# Patient Record
Sex: Female | Born: 1935 | Race: White | Hispanic: No | Marital: Married | Smoking: Never smoker
Health system: Southern US, Community
[De-identification: ages and names within clinical notes are randomized; demographics above are authoritative.]

## PROBLEM LIST (undated history)

## (undated) DIAGNOSIS — E079 Disorder of thyroid, unspecified: Secondary | ICD-10-CM

## (undated) DIAGNOSIS — I10 Essential (primary) hypertension: Secondary | ICD-10-CM

## (undated) HISTORY — DX: Disorder of thyroid, unspecified: E07.9

## (undated) HISTORY — PX: REPLACEMENT TOTAL KNEE BILATERAL: SUR1225

---

## 2006-12-23 ENCOUNTER — Ambulatory Visit: Payer: Self-pay | Admitting: Cardiology

## 2006-12-30 ENCOUNTER — Ambulatory Visit: Payer: Self-pay | Admitting: Cardiology

## 2007-02-05 ENCOUNTER — Ambulatory Visit: Payer: Self-pay | Admitting: Cardiology

## 2007-02-10 ENCOUNTER — Ambulatory Visit: Payer: Self-pay | Admitting: Cardiology

## 2010-10-27 NOTE — Assessment & Plan Note (Signed)
[facility]                          [facility] OFFICE NOTE   NAME:Hougland, MERIS REEDE                      MRN:          [id number]  DATE:12/23/2006                            DOB:          01-Sep-1935    REASON FOR CONSULTATION:  Evaluation of a 75 year old female with  presumed new onset atrial fibrillation.   HISTORY OF PRESENT ILLNESS:  The patient is a 75 year old female with  multiple cardiac factors. The patient was referred by [facility] due to complaints of palpitations and burning in the chest  occurring predominantly at night. I assumed that the patient was found  to have atrial fibrillation as this is recorded is in the notes. I do  not have an EKG however to confirm that. Today in the office, the  patient is in normal sinus rhythm. Her main complaint centers around  palpitations and burning the chest. The patient states that for the last  several weeks, she has been waking up in the middle of the night with a  sensation of burning in the retrosternal area. There is associated  shortness of breath, as well as diaphoresis. There is also some nausea.  It was felt that the patient could have gastroesophageal reflux and has  been started  in the interval on Prilosec. However, there has been no  significant improvement in her symptomatology. Also, over the last  several months, in particular, she recalls an episode about two months  ago at the floral store where she felt to have a rapid heart rate with  palpitations. She has no associated pre-syncope or syncope. Since that  time, the patient has cut back on caffeinated drinks with some  improvement in the frequency of her palpitations. Of note is also that  she has noticed that she has some shortness of breath and when she tries  to cough her palpitations seem to get better.   The patient denies dyspnea at rest, but reports exertional shortness of  breath. The patient had  laboratory work done which I reviewed today  which was essentially within normal limits including a TSH. Her EKG in  the office also shows normal sinus rhythm with no acute ischemic  changes.   MEDICATIONS:  1. Synthroid 25 mcg today.  2. Lotensin 10 mg daily.  3. Zocor 80 mg daily.  4. Prilosec 20 mg b.i.d.  5. Aspirin 81 mg daily.   PAST MEDICAL HISTORY:  Dyslipidemia, hypertension, and thyroid disease.   SOCIAL HISTORY:  The patient lives in [place]. She denies tobacco  use.   FAMILY HISTORY:  Notable for her mother dying of an aneurysm. Father  died from congestive heart failure at age 51. Brother has diabetes  mellitus and is on dialysis. Two other brothers are in good health. She  has a sister who has no significant cardiovascular disease.   REVIEW OF SYSTEMS:  As per HPI. No nausea or vomiting. No fevers or  chills. No melena or hematochezia. No dysuria or frequency. No orthopnea  or PND. The patient reports loud snoring, daytime somnolence, and early  morning fatigue.  PHYSICAL EXAMINATION:  VITAL SIGNS:  Blood pressure 160/84, heart rate  80 beats-per-minute.  GENERAL:  Well nourished white female who is somewhat pale appearing,  but in no apparent distress.  NECK:  Normal carotid upstroke and no carotid bruits.  LUNGS:  Clear breath sounds bilaterally.  HEART:  Regular rate and rhythm, normal S1, S2, no murmurs, rubs, or  gallops.  ABDOMEN:  Soft and nontender. No rebound or guarding. Good bowel sounds.  EXTREMITIES:  No cyanosis, clubbing, or edema.  NEUROLOGIC:  Alert, oriented, and grossly non-focal.   PROBLEM LIST:  1. Palpitations.  2. Paroxysmal atrial fibrillation.  3. Substernal chest pain, rule out ischemic heart disease.  4. Multiple cardiac risk factors.  5. Rule out obstructive sleep apnea.  6. Dyslipidemia.   PLAN:  1. Although I do not have documentation of atrial fibrillation, this      was diagnosed at the [facility]. We will  try to get an      EKG stat to confirm this finding. The patient now in normal sinus      rhythm.  2. I will start patient on a calcium channel blocker given her      presumed history of atrial fibrillation and recurrent palpitations,      as well as hypertension.  3. The patient's nocturnal pain is concerning for ischemic heart      disease and she will be scheduled for an adenosine Cardiolite two      day protocol.  4. I suspect also clinically that the patient may have obstructive      sleep apnea which can contribute to the frequency of atrial      fibrillation and I have referred her for an apnea link monitor.  5. The patient also has been scheduled for a CardioNet monitor to      assess her atrial fibrillation burden and further need for      treatment.  6. The patient will follow up with me in the next three weeks.     Learta Codding, MD,FACC  Electronically Signed    GED/MedQ  DD: 12/23/2006  DT: 12/24/2006  Job #: [id number]   cc:   [facility]

## 2010-10-27 NOTE — Assessment & Plan Note (Signed)
[facility]                          [facility]ICE NOTE   NAME:Sandoval, Dorothy HIRT                      MRN:          [id number]  DATE:02/10/2007                            DOB:          11/25/1935    HISTORY OF PRESENT ILLNESS:  Patient is a 75 year old female with a  history of multiple cardiac risk factors.  Patient has been evaluated  for palpitations.  There is a questionable history of paroxysmal atrial  fibrillation; however, we have not been able to document this.  The  patient had a CardioNet monitor done which showed no atrial fibrillation  but normal sinus rhythm.  Patient has also had no recurrent palpitations  on Cardizem.  Patient had a Cardiolite stress study done with overall  low risk without definite frank ischemia.   Patient had an apneonic monitor done from which we still do not have the  results available, and this will be followed up.  Patient is otherwise  asymptomatic.  She has no orthopnea, PND, palpitations, or syncope.   The patient otherwise has no complaints in the office today.   MEDICATIONS:  1. Synthroid 25 mcg p.o. daily.  2. Lotensin 10 mg p.o. daily.  3. Zocor 8 mg p.o. daily.  4. Prilosec 20 mg p.o. b.i.d.  5. Aspirin 81 mg p.o. daily.  6. Diltiazem 120 mg p.o. daily.   PHYSICAL EXAMINATION:  VITAL SIGNS:  Blood pressure 150/80, heart rate  80.  Weight is 203 pounds.  NECK:  Normal carotid upstrokes.  No carotid bruits.  LUNGS:  Clear breath sounds bilaterally.  HEART:  Regular rate and rhythm.  Normal S1 and S2.  No murmurs or  gallops.  ABDOMEN:  Soft.  EXTREMITIES:  No clubbing, cyanosis or edema.  NEURO:  Patient is alert, oriented, grossly nonfocal.   PROBLEM LIST:  1. Palpitations, resolved.  2. Questionable paroxysmal atrial fibrillation with no evidence of      CardioNet monitor.  3. Substernal chest pain, rule out for ischemic cardiomyopathy.      Negative Cardiolite study.  4. Multiple  cardiac risk factors, rule out obstructive sleep apnea,      apneonic monitor pending.  5. Dyslipidemia.   PLAN:  1. We will review the results of the apneonic monitor which are still      pending.  2. CardioNet monitor review shows no evidence of atrial fibrillation.      The patient can continue      with current medical therapy and aspirin only.  3. No further ischemia workup is needed, as the patient's Cardiolite      study was low risk.     Learta Codding, MD,FACC  Electronically Signed    GED/MedQ  DD: 02/10/2007  DT: 02/11/2007  Job #: [id number]

## 2015-12-24 ENCOUNTER — Encounter (HOSPITAL_COMMUNITY): Payer: Self-pay

## 2015-12-24 ENCOUNTER — Emergency Department (HOSPITAL_COMMUNITY)
Admission: EM | Admit: 2015-12-24 | Discharge: 2015-12-25 | Disposition: A | Discharge status: Home / Self Care | Payer: Medicare Other | Attending: Emergency Medicine | Admitting: Emergency Medicine

## 2015-12-24 ENCOUNTER — Emergency Department (HOSPITAL_COMMUNITY): Payer: Medicare Other

## 2015-12-24 DIAGNOSIS — R0602 Shortness of breath: Secondary | ICD-10-CM | POA: Diagnosis not present

## 2015-12-24 DIAGNOSIS — M7989 Other specified soft tissue disorders: Secondary | ICD-10-CM | POA: Insufficient documentation

## 2015-12-24 DIAGNOSIS — I10 Essential (primary) hypertension: Secondary | ICD-10-CM | POA: Diagnosis not present

## 2015-12-24 DIAGNOSIS — R05 Cough: Secondary | ICD-10-CM | POA: Diagnosis not present

## 2015-12-24 HISTORY — DX: Essential (primary) hypertension: I10

## 2015-12-24 MED ORDER — IPRATROPIUM-ALBUTEROL 0.5-2.5 (3) MG/3ML IN SOLN
RESPIRATORY_TRACT | Status: AC
  Administered 2015-12-24: 3 mL
  Filled 2015-12-24: qty 3

## 2015-12-24 NOTE — Progress Notes (Signed)
Patient came in with SOB. Nebulizer given, patient feels like this helped. RT will continue to monitor.

## 2015-12-24 NOTE — ED Provider Notes (Signed)
CSN: [id number]     Arrival date & time 12/24/15  2314 History  By signing my name below, I, Emmanuella Mensah, attest that this documentation has been prepared under the direction and in the presence of Shon Batonourtney F Indiana Gamero, MD. Electronically Signed: Angelene GiovanniEmmanuella Mensah, ED Scribe. 12/24/2015. 2:34 AM.    Chief Complaint  Patient presents with  . Shortness of Breath   Patient is a 80 y.o. female presenting with shortness of breath. The history is provided by the patient. No language interpreter was used.  Shortness of Breath Severity:  Moderate Onset quality:  Gradual Timing:  Intermittent Progression:  Worsening Relieved by:  None tried Worsened by:  Nothing tried Ineffective treatments:  None tried Associated symptoms: cough   Associated symptoms: no abdominal pain, no chest pain, no fever and no vomiting   Risk factors: no recent alcohol use    HPI Comments: Dorothy Sandoval is a 80 y.o. female with a hx of hypertension who presents to the Emergency Department complaining of gradually worsening shortness of breath onset several months ago. She reports associated leg swelling and non-productive cough. She states that her SOB is worse when she lays down. Pt denies any home O2 use. No alleviating factors noted. Pt has not tried any medications PTA. She denies any sick contacts. She states that she takes Lasix and has not had any medication changes. She denies a hx of CHF or COPD. Pt is not a current smoker. She reports NKDA. She denies any fever, chills, chest pain, abdominal pain, or n/v/d.    Past Medical History  Diagnosis Date  . Hypertension    History reviewed. No pertinent past surgical history. No family history on file. Social History  Substance Use Topics  . Smoking status: Never Smoker   . Smokeless tobacco: None  . Alcohol Use: No   OB History    No data available     Review of Systems  Constitutional: Negative for fever and chills.  Respiratory: Positive for cough  and shortness of breath.   Cardiovascular: Positive for leg swelling. Negative for chest pain.  Gastrointestinal: Negative for vomiting and abdominal pain.  All other systems reviewed and are negative.     Allergies  Review of patient's allergies indicates no known allergies.  Home Medications   Prior to Admission medications   Not on File   BP 148/54 mmHg  Pulse 74  Temp(Src) 97.5 F (36.4 C) (Oral)  Resp 14  SpO2 97% Physical Exam  Constitutional: She is oriented to person, place, and time. She appears well-developed and well-nourished.  Elderly, no acute distress  HENT:  Head: Normocephalic and atraumatic.  Cardiovascular: Normal rate, regular rhythm and normal heart sounds.   No murmur heard. Pulmonary/Chest: Effort normal and breath sounds normal. No respiratory distress. She has no wheezes.  No crackles noted  Abdominal: Soft. Bowel sounds are normal. There is no tenderness. There is no rebound.  Musculoskeletal:  Trace bilateral lower extremity edema  Neurological: She is alert and oriented to person, place, and time.  Skin: Skin is warm and dry.  Psychiatric: She has a normal mood and affect.  Nursing note and vitals reviewed.   ED Course  Procedures (including critical care time) DIAGNOSTIC STUDIES: Oxygen Saturation is 99% on [place], normal by my interpretation.    COORDINATION OF CARE: 12:14 AM- Pt advised of plan for treatment and pt agrees. Pt will receive chest x-ray, lab work, and EKG for further evaluation. She will also receive Duoneb.  Labs Review Labs Reviewed  BASIC METABOLIC PANEL - Abnormal; Notable for the following:    Glucose, Bld 127 (*)    All other components within normal limits  D-DIMER, QUANTITATIVE (NOT AT [facility]) - Abnormal; Notable for the following:    D-Dimer, Quant 0.55 (*)    All other components within normal limits  CBC WITH DIFFERENTIAL/PLATELET  BRAIN NATRIURETIC PEPTIDE  TROPONIN I    Imaging Review Dg Chest 2  View  12/24/2015  CLINICAL DATA:  Nonproductive cough and worsening shortness of breath for 2 days. History of hypertension. EXAM: CHEST  2 VIEW COMPARISON:  None available for comparison at time of study interpretation. FINDINGS: The cardiac silhouette is moderately enlarged, mediastinal silhouette is nonsuspicious. Mild bronchitic changes. Strandy densities in lung bases. No pleural effusion or focal consolidation. Mild biapical pleural thickening. No pneumothorax. Large body habitus. Osteopenia. Sub cm calcification projecting at RIGHT humeral head associated with calcific tendinopathy. IMPRESSION: Moderate cardiomegaly. Mild bronchitic changes. Bibasilar atelectasis/scarring. Electronically Signed   By: Awilda Metro M.D.   On: 12/24/2015 23:53   Shon Baton, MD has personally reviewed and evaluated these images and lab results as part of her medical decision-making.   EKG Interpretation   Date/Time:  Wednesday December 24 2015 23:20:49 EDT Ventricular Rate:  68 PR Interval:    QRS Duration: 94 QT Interval:  392 QTC Calculation: 417 R Axis:   51 Text Interpretation:  Sinus rhythm Abnormal R-wave progression, early  transition Confirmed by Calix Heinbaugh  MD, Elijah Phommachanh ([id number]) on 12/25/2015 12:14:38  AM      MDM   Final diagnoses:  Shortness of breath    Patient presents with shortness of breath. Nontoxic on exam. Afebrile. Mildly hypertensive but otherwise vital signs are reassuring. Physical exam is largely unremarkable. She does not appear overtly volume overloaded. She is in no acute distress. EKG is nonischemic. chest x-ray shows cardiomegaly without edema.  No documented echocardiogram in our system. BNP, troponin, and basic labwork is reassuring. D-dimer is 0.55. Based on age adjusted cut offs, this is negative. Patient was able to ambulate and maintain pulse ox 96-97%. She continues to be in no acute distress. Discussed with patient and her family that she needs to follow-up with  cardiology for an echocardiogram as well as with her primary physician as she may ultimately need further pulmonary testing if her symptoms persist.  After history, exam, and medical workup I feel the patient has been appropriately medically screened and is safe for discharge home. Pertinent diagnoses were discussed with the patient. Patient was given return precautions.  I personally performed the services described in this documentation, which was scribed in my presence. The recorded information has been reviewed and is accurate.   Shon Baton, MD 12/25/15 [phone]

## 2015-12-24 NOTE — ED Notes (Signed)
Pt reports that she has been coughing for 2 days. Cough non productive. Reports difficulty breathing laying down. Pt reports she does not wear O2 at home

## 2015-12-24 NOTE — ED Notes (Signed)
Sob progressively worse today

## 2015-12-25 LAB — BASIC METABOLIC PANEL
ANION GAP: 6 (ref 5–15)
BUN: 11 mg/dL (ref 6–20)
CHLORIDE: 102 mmol/L (ref 101–111)
CO2: 27 mmol/L (ref 22–32)
Calcium: 9.1 mg/dL (ref 8.9–10.3)
Creatinine, Ser: 0.65 mg/dL (ref 0.44–1.00)
GFR calc Af Amer: >60 mL/min (ref >60)
GLUCOSE: 127 mg/dL — AB (ref 65–99)
POTASSIUM: 4.1 mmol/L (ref 3.5–5.1)
Sodium: 135 mmol/L (ref 135–145)

## 2015-12-25 LAB — CBC WITH DIFFERENTIAL/PLATELET
BASOS ABS: 0 10*3/uL (ref 0.0–0.1)
Basophils Relative: 0 %
EOS PCT: 2 %
Eosinophils Absolute: 0.2 10*3/uL (ref 0.0–0.7)
HEMATOCRIT: 38.5 % (ref 36.0–46.0)
HEMOGLOBIN: 12.4 g/dL (ref 12.0–15.0)
LYMPHS PCT: 18 %
Lymphs Abs: 1.3 10*3/uL (ref 0.7–4.0)
MCH: 27.2 pg (ref 26.0–34.0)
MCHC: 32.2 g/dL (ref 30.0–36.0)
MCV: 84.4 fL (ref 78.0–100.0)
MONO ABS: 0.7 10*3/uL (ref 0.1–1.0)
MONOS PCT: 10 %
Neutro Abs: 5 10*3/uL (ref 1.7–7.7)
Neutrophils Relative %: 70 %
Platelets: 201 10*3/uL (ref 150–400)
RBC: 4.56 MIL/uL (ref 3.87–5.11)
RDW: 13.8 % (ref 11.5–15.5)
WBC: 7.2 10*3/uL (ref 4.0–10.5)

## 2015-12-25 LAB — TROPONIN I: Troponin I: <0.03 ng/mL (ref <0.03)

## 2015-12-25 LAB — BRAIN NATRIURETIC PEPTIDE: B Natriuretic Peptide: 26 pg/mL (ref 0.0–100.0)

## 2015-12-25 LAB — D-DIMER, QUANTITATIVE (NOT AT ARMC): D DIMER QUANT: 0.55 ug{FEU}/mL — AB (ref 0.00–0.50)

## 2015-12-25 NOTE — Discharge Instructions (Signed)
You were seen today for shortness of breath. Your workup in the emergency room is reassuring. There is no evidence of pneumonia, collapsed lung, fluid on the lungs. Your heart does appear mildly enlarged on her chest x-ray. You may need a follow-up echocardiogram. You will be given follow-up information to for cardiology. You may also need pulmonary function testing. Follow-up with your primary physician. If you have any new or worsening symptoms she should be reevaluated immediately.  Shortness of Breath Shortness of breath means you have trouble breathing. It could also mean that you have a medical problem. You should get immediate medical care for shortness of breath. CAUSES   Not enough oxygen in the air such as with high altitudes or a smoke-filled room.  Certain lung diseases, infections, or problems.  Heart disease or conditions, such as angina or heart failure.  Low red blood cells (anemia).  Poor physical fitness, which can cause shortness of breath when you exercise.  Chest or back injuries or stiffness.  Being overweight.  Smoking.  Anxiety, which can make you feel like you are not getting enough air. DIAGNOSIS  Serious medical problems can often be found during your physical exam. Tests may also be done to determine why you are having shortness of breath. Tests may include:  Chest X-rays.  Lung function tests.  Blood tests.  An electrocardiogram (ECG).  An ambulatory electrocardiogram. An ambulatory ECG records your heartbeat patterns over a 24-hour period.  Exercise testing.  A transthoracic echocardiogram (TTE). During echocardiography, sound waves are used to evaluate how blood flows through your heart.  A transesophageal echocardiogram (TEE).  Imaging scans. Your health care provider may not be able to find a cause for your shortness of breath after your exam. In this case, it is important to have a follow-up exam with your health care provider as directed.    TREATMENT  Treatment for shortness of breath depends on the cause of your symptoms and can vary greatly. HOME CARE INSTRUCTIONS   Do not smoke. Smoking is a common cause of shortness of breath. If you smoke, ask for help to quit.  Avoid being around chemicals or things that may bother your breathing, such as paint fumes and dust.  Rest as needed. Slowly resume your usual activities.  If medicines were prescribed, take them as directed for the full length of time directed. This includes oxygen and any inhaled medicines.  Keep all follow-up appointments as directed by your health care provider. SEEK MEDICAL CARE IF:   Your condition does not improve in the time expected.  You have a hard time doing your normal activities even with rest.  You have any new symptoms. SEEK IMMEDIATE MEDICAL CARE IF:   Your shortness of breath gets worse.  You feel light-headed, faint, or develop a cough not controlled with medicines.  You start coughing up blood.  You have pain with breathing.  You have chest pain or pain in your arms, shoulders, or abdomen.  You have a fever.  You are unable to walk up stairs or exercise the way you normally do. MAKE SURE YOU:  Understand these instructions.  Will watch your condition.  Will get help right away if you are not doing well or get worse.   This information is not intended to replace advice given to you by your health care provider. Make sure you discuss any questions you have with your health care provider.   Document Released: 02/23/2001 Document Revised: 06/05/2013 Document Reviewed: 08/16/2011  Elsevier Interactive Patient Education ©2016 [employer] ° °

## 2015-12-25 NOTE — ED Notes (Signed)
Ambulate patient on room air with pulse ox. Pt O2 sat ranged 96% - 97%

## 2017-04-27 DIAGNOSIS — M1712 Unilateral primary osteoarthritis, left knee: Secondary | ICD-10-CM | POA: Insufficient documentation

## 2018-05-02 ENCOUNTER — Ambulatory Visit (HOSPITAL_COMMUNITY)
Admission: RE | Admit: 2018-05-02 | Discharge: 2018-05-02 | Disposition: A | Discharge status: Home / Self Care | Payer: Medicare Other | Source: Ambulatory Visit | Attending: *Deleted | Admitting: *Deleted

## 2018-05-02 ENCOUNTER — Other Ambulatory Visit (HOSPITAL_COMMUNITY): Payer: Self-pay | Admitting: *Deleted

## 2018-05-02 DIAGNOSIS — E039 Hypothyroidism, unspecified: Secondary | ICD-10-CM | POA: Diagnosis not present

## 2018-05-02 DIAGNOSIS — K449 Diaphragmatic hernia without obstruction or gangrene: Secondary | ICD-10-CM | POA: Insufficient documentation

## 2018-05-02 DIAGNOSIS — I517 Cardiomegaly: Secondary | ICD-10-CM | POA: Diagnosis not present

## 2018-05-02 DIAGNOSIS — R918 Other nonspecific abnormal finding of lung field: Secondary | ICD-10-CM | POA: Insufficient documentation

## 2018-06-12 ENCOUNTER — Observation Stay (HOSPITAL_COMMUNITY)
Admission: EM | Admit: 2018-06-12 | Discharge: 2018-06-13 | Disposition: A | Discharge status: Home / Self Care | Payer: Medicare Other | Attending: General Surgery | Admitting: General Surgery

## 2018-06-12 ENCOUNTER — Other Ambulatory Visit: Payer: Self-pay

## 2018-06-12 ENCOUNTER — Observation Stay (HOSPITAL_BASED_OUTPATIENT_CLINIC_OR_DEPARTMENT_OTHER): Payer: Medicare Other

## 2018-06-12 ENCOUNTER — Emergency Department (HOSPITAL_COMMUNITY): Payer: Medicare Other

## 2018-06-12 ENCOUNTER — Encounter (HOSPITAL_COMMUNITY): Payer: Self-pay | Admitting: Emergency Medicine

## 2018-06-12 ENCOUNTER — Observation Stay (HOSPITAL_COMMUNITY): Payer: Medicare Other

## 2018-06-12 DIAGNOSIS — K8000 Calculus of gallbladder with acute cholecystitis without obstruction: Secondary | ICD-10-CM | POA: Diagnosis not present

## 2018-06-12 DIAGNOSIS — Z7982 Long term (current) use of aspirin: Secondary | ICD-10-CM | POA: Insufficient documentation

## 2018-06-12 DIAGNOSIS — I10 Essential (primary) hypertension: Secondary | ICD-10-CM | POA: Diagnosis not present

## 2018-06-12 DIAGNOSIS — Z01818 Encounter for other preprocedural examination: Secondary | ICD-10-CM

## 2018-06-12 DIAGNOSIS — R109 Unspecified abdominal pain: Secondary | ICD-10-CM | POA: Diagnosis present

## 2018-06-12 DIAGNOSIS — Z96653 Presence of artificial knee joint, bilateral: Secondary | ICD-10-CM | POA: Insufficient documentation

## 2018-06-12 DIAGNOSIS — K8080 Other cholelithiasis without obstruction: Secondary | ICD-10-CM

## 2018-06-12 DIAGNOSIS — Z01810 Encounter for preprocedural cardiovascular examination: Secondary | ICD-10-CM

## 2018-06-12 DIAGNOSIS — Z79890 Hormone replacement therapy (postmenopausal): Secondary | ICD-10-CM | POA: Diagnosis not present

## 2018-06-12 DIAGNOSIS — K8020 Calculus of gallbladder without cholecystitis without obstruction: Secondary | ICD-10-CM

## 2018-06-12 DIAGNOSIS — Z79899 Other long term (current) drug therapy: Secondary | ICD-10-CM | POA: Insufficient documentation

## 2018-06-12 LAB — COMPREHENSIVE METABOLIC PANEL
ALBUMIN: 3.5 g/dL (ref 3.5–5.0)
ALT: 11 U/L (ref 0–44)
ANION GAP: 8 (ref 5–15)
AST: 16 U/L (ref 15–41)
Alkaline Phosphatase: 70 U/L (ref 38–126)
BUN: 8 mg/dL (ref 8–23)
CHLORIDE: 99 mmol/L (ref 98–111)
CO2: 25 mmol/L (ref 22–32)
Calcium: 9.3 mg/dL (ref 8.9–10.3)
Creatinine, Ser: 0.58 mg/dL (ref 0.44–1.00)
GFR calc Af Amer: >60 mL/min (ref >60)
GFR calc non Af Amer: >60 mL/min (ref >60)
GLUCOSE: 146 mg/dL — AB (ref 70–99)
POTASSIUM: 4 mmol/L (ref 3.5–5.1)
SODIUM: 132 mmol/L — AB (ref 135–145)
Total Bilirubin: 0.5 mg/dL (ref 0.3–1.2)
Total Protein: 6.6 g/dL (ref 6.5–8.1)

## 2018-06-12 LAB — CBC WITH DIFFERENTIAL/PLATELET
Abs Immature Granulocytes: 0.04 10*3/uL (ref 0.00–0.07)
BASOS ABS: 0 10*3/uL (ref 0.0–0.1)
BASOS PCT: 0 %
EOS ABS: 0 10*3/uL (ref 0.0–0.5)
EOS PCT: 0 %
HCT: 38.5 % (ref 36.0–46.0)
Hemoglobin: 11.9 g/dL — ABNORMAL LOW (ref 12.0–15.0)
IMMATURE GRANULOCYTES: 0 %
LYMPHS ABS: 0.9 10*3/uL (ref 0.7–4.0)
Lymphocytes Relative: 7 %
MCH: 24.8 pg — ABNORMAL LOW (ref 26.0–34.0)
MCHC: 30.9 g/dL (ref 30.0–36.0)
MCV: 80.4 fL (ref 80.0–100.0)
Monocytes Absolute: 0.5 10*3/uL (ref 0.1–1.0)
Monocytes Relative: 4 %
NEUTROS PCT: 89 %
NRBC: 0 % (ref 0.0–0.2)
Neutro Abs: 10.7 10*3/uL — ABNORMAL HIGH (ref 1.7–7.7)
PLATELETS: 333 10*3/uL (ref 150–400)
RBC: 4.79 MIL/uL (ref 3.87–5.11)
RDW: 14.5 % (ref 11.5–15.5)
WBC: 12.2 10*3/uL — AB (ref 4.0–10.5)

## 2018-06-12 LAB — ECHOCARDIOGRAM COMPLETE
HEIGHTINCHES: 66 in
Weight: 3520 oz

## 2018-06-12 LAB — URINALYSIS, ROUTINE W REFLEX MICROSCOPIC
BACTERIA UA: NONE SEEN
BILIRUBIN URINE: NEGATIVE
Glucose, UA: NEGATIVE mg/dL
KETONES UR: 5 mg/dL — AB
LEUKOCYTES UA: NEGATIVE
NITRITE: NEGATIVE
PH: 7 (ref 5.0–8.0)
Protein, ur: NEGATIVE mg/dL

## 2018-06-12 LAB — LIPASE, BLOOD: Lipase: 26 U/L (ref 11–51)

## 2018-06-12 MED ORDER — LEVOTHYROXINE SODIUM 25 MCG PO TABS
25.0000 ug | ORAL_TABLET | Freq: Every day | ORAL | Status: DC
  Administered 2018-06-12 – 2018-06-13 (×2): 25 ug via ORAL
  Filled 2018-06-12 (×3): qty 1

## 2018-06-12 MED ORDER — ENOXAPARIN SODIUM 40 MG/0.4ML ~~LOC~~ SOLN
40.0000 mg | SUBCUTANEOUS | Status: DC
  Administered 2018-06-12: 40 mg via SUBCUTANEOUS
  Filled 2018-06-12: qty 0.4

## 2018-06-12 MED ORDER — PANTOPRAZOLE SODIUM 40 MG PO TBEC
40.0000 mg | DELAYED_RELEASE_TABLET | Freq: Every day | ORAL | Status: DC
  Administered 2018-06-12: 40 mg via ORAL
  Filled 2018-06-12: qty 1

## 2018-06-12 MED ORDER — FLUTICASONE FUROATE-VILANTEROL 100-25 MCG/INH IN AEPB
1.0000 | INHALATION_SPRAY | Freq: Every day | RESPIRATORY_TRACT | Status: DC
  Filled 2018-06-12: qty 28

## 2018-06-12 MED ORDER — ORAL CARE MOUTH RINSE
15.0000 mL | Freq: Two times a day (BID) | OROMUCOSAL | Status: DC
  Administered 2018-06-12 – 2018-06-13 (×2): 15 mL via OROMUCOSAL

## 2018-06-12 MED ORDER — ALBUTEROL SULFATE (2.5 MG/3ML) 0.083% IN NEBU: 5.0000 mg | INHALATION_SOLUTION | Freq: Once | RESPIRATORY_TRACT | Status: DC

## 2018-06-12 MED ORDER — OXYCODONE HCL 5 MG PO TABS: 5.0000 mg | ORAL_TABLET | ORAL | Status: DC | PRN

## 2018-06-12 MED ORDER — ONDANSETRON HCL 4 MG/2ML IJ SOLN
4.0000 mg | Freq: Four times a day (QID) | INTRAMUSCULAR | Status: DC | PRN
  Administered 2018-06-13: 4 mg via INTRAVENOUS

## 2018-06-12 MED ORDER — DOCUSATE SODIUM 100 MG PO CAPS
100.0000 mg | ORAL_CAPSULE | Freq: Two times a day (BID) | ORAL | Status: DC
  Administered 2018-06-12 (×2): 100 mg via ORAL
  Filled 2018-06-12 (×2): qty 1

## 2018-06-12 MED ORDER — IOPAMIDOL (ISOVUE-300) INJECTION 61%
100.0000 mL | Freq: Once | INTRAVENOUS | Status: AC | PRN
  Administered 2018-06-12: 100 mL via INTRAVENOUS

## 2018-06-12 MED ORDER — SIMETHICONE 80 MG PO CHEW: 40.0000 mg | CHEWABLE_TABLET | Freq: Four times a day (QID) | ORAL | Status: DC | PRN

## 2018-06-12 MED ORDER — UMECLIDINIUM BROMIDE 62.5 MCG/INH IN AEPB
1.0000 | INHALATION_SPRAY | Freq: Every day | RESPIRATORY_TRACT | Status: DC
  Filled 2018-06-12: qty 7

## 2018-06-12 MED ORDER — ONDANSETRON 4 MG PO TBDP: 4.0000 mg | ORAL_TABLET | Freq: Four times a day (QID) | ORAL | Status: DC | PRN

## 2018-06-12 MED ORDER — METOPROLOL SUCCINATE ER 25 MG PO TB24
25.0000 mg | ORAL_TABLET | Freq: Every day | ORAL | Status: DC
  Administered 2018-06-12: 25 mg via ORAL
  Filled 2018-06-12: qty 1

## 2018-06-12 MED ORDER — LACTATED RINGERS IV SOLN
INTRAVENOUS | Status: DC
  Administered 2018-06-12 – 2018-06-13 (×2): via INTRAVENOUS

## 2018-06-12 MED ORDER — MORPHINE SULFATE (PF) 2 MG/ML IV SOLN
2.0000 mg | Freq: Once | INTRAVENOUS | Status: AC
Start: 2018-06-12 — End: 2018-06-12
  Administered 2018-06-12: 2 mg via INTRAVENOUS
  Filled 2018-06-12: qty 1

## 2018-06-12 MED ORDER — ONDANSETRON HCL 4 MG/2ML IJ SOLN
4.0000 mg | Freq: Once | INTRAMUSCULAR | Status: AC
  Administered 2018-06-12: 4 mg via INTRAVENOUS
  Filled 2018-06-12: qty 2

## 2018-06-12 MED ORDER — CITALOPRAM HYDROBROMIDE 20 MG PO TABS
10.0000 mg | ORAL_TABLET | Freq: Every day | ORAL | Status: DC
  Administered 2018-06-12: 10 mg via ORAL
  Filled 2018-06-12 (×4): qty 1

## 2018-06-12 MED ORDER — MORPHINE SULFATE (PF) 2 MG/ML IV SOLN: 2.0000 mg | INTRAVENOUS | Status: DC | PRN

## 2018-06-12 MED ORDER — FLUTICASONE-UMECLIDIN-VILANT 100-62.5-25 MCG/INH IN AEPB: 1.0000 | INHALATION_SPRAY | Freq: Every day | RESPIRATORY_TRACT | Status: DC

## 2018-06-12 MED ORDER — MORPHINE SULFATE (PF) 2 MG/ML IV SOLN
2.0000 mg | Freq: Once | INTRAVENOUS | Status: AC
  Administered 2018-06-12: 2 mg via INTRAVENOUS
  Filled 2018-06-12: qty 1

## 2018-06-12 MED ORDER — CHLORHEXIDINE GLUCONATE 0.12 % MT SOLN
15.0000 mL | Freq: Two times a day (BID) | OROMUCOSAL | Status: DC
  Administered 2018-06-12: 15 mL via OROMUCOSAL
  Filled 2018-06-12: qty 15

## 2018-06-12 MED ORDER — DIPHENHYDRAMINE HCL 12.5 MG/5ML PO ELIX: 12.5000 mg | ORAL_SOLUTION | Freq: Four times a day (QID) | ORAL | Status: DC | PRN

## 2018-06-12 MED ORDER — SODIUM CHLORIDE 0.9 % IV BOLUS
500.0000 mL | Freq: Once | INTRAVENOUS | Status: AC
Start: 2018-06-12 — End: 2018-06-12
  Administered 2018-06-12: 500 mL via INTRAVENOUS

## 2018-06-12 MED ORDER — LORAZEPAM 0.5 MG PO TABS: 0.5000 mg | ORAL_TABLET | Freq: Four times a day (QID) | ORAL | Status: DC | PRN

## 2018-06-12 MED ORDER — SODIUM CHLORIDE 0.9 % IV SOLN
1.0000 g | INTRAVENOUS | Status: DC
  Filled 2018-06-12: qty 1

## 2018-06-12 MED ORDER — DIPHENHYDRAMINE HCL 50 MG/ML IJ SOLN: 12.5000 mg | Freq: Four times a day (QID) | INTRAMUSCULAR | Status: DC | PRN

## 2018-06-12 MED ORDER — MUPIROCIN 2 % EX OINT
1.0000 "application " | TOPICAL_OINTMENT | Freq: Two times a day (BID) | CUTANEOUS | Status: DC
  Filled 2018-06-12: qty 22

## 2018-06-12 MED ORDER — FUROSEMIDE 20 MG PO TABS
20.0000 mg | ORAL_TABLET | Freq: Every day | ORAL | Status: DC
  Administered 2018-06-12: 20 mg via ORAL
  Filled 2018-06-12: qty 1

## 2018-06-12 MED ORDER — SODIUM CHLORIDE 0.9 % IV SOLN
2.0000 g | INTRAVENOUS | Status: DC
  Administered 2018-06-12: 2 g via INTRAVENOUS
  Filled 2018-06-12 (×4): qty 20

## 2018-06-12 MED ORDER — KETOROLAC TROMETHAMINE 15 MG/ML IJ SOLN
15.0000 mg | Freq: Four times a day (QID) | INTRAMUSCULAR | Status: DC | PRN
  Administered 2018-06-12 – 2018-06-13 (×2): 15 mg via INTRAVENOUS
  Filled 2018-06-12 (×2): qty 1

## 2018-06-12 MED ORDER — METOPROLOL TARTRATE 5 MG/5ML IV SOLN: 5.0000 mg | Freq: Four times a day (QID) | INTRAVENOUS | Status: DC | PRN

## 2018-06-12 NOTE — H&P (Addendum)
[facility] History and Physical  Reason for Referral: Cholecystitis  Referring Physician:  Dr. Lynelle Doctor   Chief Complaint    Abdominal Pain      Dorothy Sandoval is a 82 y.o. female.  HPI: Dorothy Sandoval is a 82 yo with HTN, GERD, hiatal hernia, who presented to the hospital with acute onset of epigastric and RUQ pain that started last night. She has had some dry heaving and nausea but no vomiting. She reports pain in the RUQ and epigastric area and she tried some milk of magnesia at home without relief. CT scan was done that showed possible cholecystitis with a large stone and fluid. I requested an [place] to rule out any choledocholithiasis, and her gallbladder does have stones but no real thickening. She remains tender and nauseous with WBC to 12.   No prior abdominal surgeries. She knows about a hiatal hernia due to workup for a cough. She drives and does most of her activities of daily living. She does not have any chest pain or SOB complaints. She does use an inhaler daily.   Past Medical History:  Diagnosis Date  . Hypertension     Past Surgical History:  Procedure Laterality Date  . REPLACEMENT TOTAL KNEE BILATERAL      Family History  Problem Relation Age of Onset  . Heart failure Father   . Diabetes Sister   . Diabetes Brother     Social History   Tobacco Use  . Smoking status: Never Smoker  . Smokeless tobacco: Never Used  Substance Use Topics  . Alcohol use: No  . Drug use: No    Medications:  I have reviewed the patient's current medications. Prior to Admission: (Not in a hospital admission)  Scheduled: . citalopram  10 mg Oral Daily  . docusate sodium  100 mg Oral BID  . enoxaparin (LOVENOX) injection  40 mg Subcutaneous Q24H  . Fluticasone-Umeclidin-Vilant  1 puff Inhalation Daily  . furosemide  20 mg Oral Daily  . levothyroxine  25 mcg Oral Daily  . metoprolol succinate  25 mg Oral Daily  . pantoprazole  40 mg Oral Daily   Continuous: .  cefoTEtan (CEFOTAN) IV    . cefTRIAXone (ROCEPHIN)  IV    . lactated ringers     ZOX:[id number] **OR** diphenhydrAMINE, ketorolac, LORazepam, metoprolol tartrate, morphine injection, ondansetron **OR** ondansetron (ZOFRAN) IV, oxyCODONE, simethicone  Allergies  Allergen Reactions  . Meloxicam Diarrhea and Other (See Comments)    WEAKNESS  . Other Other (See Comments)    MODERATE TO SEVERE BRUISING. USE PAPER TAPE ONLY.     ROS:  A comprehensive review of systems was negative except for: Gastrointestinal: positive for abdominal pain and nausea  Blood pressure (!) 147/73, pulse 88, temperature 98.2 F (36.8 C), temperature source Oral, resp. rate (!) 24, height 5\' 6"  (1.676 m), weight 99.8 kg, SpO2 95 %. Physical Exam Vitals signs reviewed.  Constitutional:      Appearance: She is well-developed.  HENT:     Head: Normocephalic and atraumatic.  Cardiovascular:     Rate and Rhythm: Normal rate.  Pulmonary:     Effort: Pulmonary effort is normal.  Abdominal:     General: Abdomen is flat.     Palpations: Abdomen is soft.     Tenderness: There is abdominal tenderness in the right upper quadrant and epigastric area. There is no guarding or rebound.  Skin:    General: Skin is warm and dry.  Neurological:  General: No focal deficit present.     Mental Status: She is alert and oriented to person, place, and time.  Psychiatric:        Mood and Affect: Mood normal.        Behavior: Behavior normal.     Results: Results for orders placed or performed during the hospital encounter of 06/12/18 (from the past 48 hour(s))  CBC with Differential     Status: Abnormal   Collection Time: 06/12/18  9:01 AM  Result Value Ref Range   WBC 12.2 (H) 4.0 - 10.5 K/uL   RBC 4.79 3.87 - 5.11 MIL/uL   Hemoglobin 11.9 (L) 12.0 - 15.0 g/dL   HCT 40.938.5 81.136.0 - 91.446.0 %   MCV 80.4 80.0 - 100.0 fL   MCH 24.8 (L) 26.0 - 34.0 pg   MCHC 30.9 30.0 - 36.0 g/dL   RDW 78.214.5 95.611.5 - 21.315.5 %   Platelets  333 150 - 400 K/uL   nRBC 0.0 0.0 - 0.2 %   Neutrophils Relative % 89 %   Neutro Abs 10.7 (H) 1.7 - 7.7 K/uL   Lymphocytes Relative 7 %   Lymphs Abs 0.9 0.7 - 4.0 K/uL   Monocytes Relative 4 %   Monocytes Absolute 0.5 0.1 - 1.0 K/uL   Eosinophils Relative 0 %   Eosinophils Absolute 0.0 0.0 - 0.5 K/uL   Basophils Relative 0 %   Basophils Absolute 0.0 0.0 - 0.1 K/uL   Immature Granulocytes 0 %   Abs Immature Granulocytes 0.04 0.00 - 0.07 K/uL    Comment: Performed at [facility], [address] [place], [place] [address]  Comprehensive metabolic panel     Status: Abnormal   Collection Time: 06/12/18  9:01 AM  Result Value Ref Range   Sodium 132 (L) 135 - 145 mmol/L   Potassium 4.0 3.5 - 5.1 mmol/L   Chloride 99 98 - 111 mmol/L   CO2 25 22 - 32 mmol/L   Glucose, Bld 146 (H) 70 - 99 mg/dL   BUN 8 8 - 23 mg/dL   Creatinine, Ser 8.460.58 0.44 - 1.00 mg/dL   Calcium 9.3 8.9 - 96.210.3 mg/dL   Total Protein 6.6 6.5 - 8.1 g/dL   Albumin 3.5 3.5 - 5.0 g/dL   AST 16 15 - 41 U/L   ALT 11 0 - 44 U/L   Alkaline Phosphatase 70 38 - 126 U/L   Total Bilirubin 0.5 0.3 - 1.2 mg/dL   GFR calc non Af Amer >60 >60 mL/min   GFR calc Af Amer >60 >60 mL/min   Anion gap 8 5 - 15    Comment: Performed at [facility], [address] [place], [place] [address]  Lipase, blood     Status: None   Collection Time: 06/12/18  9:01 AM  Result Value Ref Range   Lipase 26 11 - 51 U/L    Comment: Performed at [facility], [address] [place], [place] [address]   Reviewed CT and US- distended gallbladder with stones, large stone, no extrahepatic dilation, some fluid around gallbladder   Ct Abdomen Pelvis W Contrast  Result Date: 06/12/2018 CLINICAL DATA:  82 year old with diffuse abdominal pain. Nausea and vomiting. EXAM: CT ABDOMEN AND PELVIS WITH CONTRAST TECHNIQUE: Multidetector CT imaging of the abdomen and pelvis was performed using the standard protocol following bolus administration of intravenous  contrast. CONTRAST:  100mL ISOVUE-300 IOPAMIDOL (ISOVUE-300) INJECTION 61% COMPARISON:  None. FINDINGS: Lower chest: Volume loss in the right  lower lobe associated with a hiatal hernia. No pleural effusions. Hepatobiliary: Cholelithiasis with small amount of pericholecystic edema or stranding. Evidence for a large 2.2 cm stone at the gallbladder base. High-density material within the gallbladder. Small amount of pericholecystic fluid. Mild intrahepatic biliary dilatation. Main portal venous system is patent. Extrahepatic bile duct is not dilated. Pancreas: Unremarkable. No pancreatic ductal dilatation or surrounding inflammatory changes. Spleen: Normal in size without focal abnormality. Adrenals/Urinary Tract: Normal adrenal glands. Normal appearance of both kidneys without hydronephrosis. Stomach/Bowel: Large hiatal hernia containing majority of the stomach. No evidence for gastric dilatation or inflammation. Mild inflammatory changes near the duodenal bulb probably secondary to the gallbladder inflammation. Mild inflammation around the hepatic flexure probably secondary to the gallbladder inflammation. Appendix is not confidently identified but no inflammatory changes around the cecum or terminal ileum. Vascular/Lymphatic: Main visceral arteries are patent. Atherosclerotic calcifications in the aorta without aneurysm. Venous structures are unremarkable. No lymph node enlargement in the abdomen or pelvis. Reproductive: Uterus and bilateral adnexa are unremarkable. Other: Trace free fluid in the pelvis. Negative for free air. Periumbilical ventral hernia containing fat on sequence 4, image 63. Bilateral inguinal hernias containing fat. Musculoskeletal: Degenerative facet disease in the lumbar spine. IMPRESSION: 1. Cholelithiasis and evidence for acute cholecystitis. Small amount of pericholecystic fluid. Trace fluid in the pelvis. 2. Large hiatal hernia. 3. Uncomplicated ventral and inguinal hernias. Electronically  Signed   By: Richarda OverlieAdam  Henn M.D.   On: 06/12/2018 10:55   Koreas Abdomen Limited Ruq  Result Date: 06/12/2018 CLINICAL DATA:  Cholelithiasis. EXAM: ULTRASOUND ABDOMEN LIMITED RIGHT UPPER QUADRANT COMPARISON:  Abdominal CT from earlier today FINDINGS: Gallbladder: Two large calculi; no gallbladder wall calcification by CT. No focal tenderness or convincing wall thickening. Pericholecystic low-density deep to the gallbladder on prior CT; no visible pericholecystic edema. Common bile duct: Diameter: 4 mm Liver: No focal lesion identified. Within normal limits in parenchymal echogenicity. Portal vein is patent on color Doppler imaging with normal direction of blood flow towards the liver. IMPRESSION: Cholelithiasis without associated findings of acute cholecystitis. Given discrepancy with prior CT, consider HIDA scan. Electronically Signed   By: Marnee SpringJonathon  Watts M.D.   On: 06/12/2018 11:40     Assessment & Plan:  Darci NeedleRebecca L Sandoval is a 82 y.o. female with what is likely acute cholecystitis given her pain and WBC and CT findings. Otherwise relatively healthy.  No prior cardiac issues. Had knee surgery 2 years ago without issues.  -Admit -PRN for pain -Home meds ordered -ECHO ordered preop give age, EKG similar to prior, SR  -Preop CXR ordered  -Clear diet, NPO midnight -CMP in AM -LR @ 50cc overnight -SCDs, lovenox  -Antibiotics for possible cholecystitis  -Preop orders done   All questions were answered to the satisfaction of the patient and family.  PLAN: I counseled the patient about the indication, risks and benefits of laparoscopic cholecystectomy.  She understands there is a very small chance for bleeding, infection, injury to normal structures (including common bile duct), conversion to open surgery, persistent symptoms, evolution of postcholecystectomy diarrhea, need for secondary interventions, anesthesia reaction, cardiopulmonary issues and other risks not specifically detailed here. I described  the expected recovery, the plan for follow-up and the restrictions during the recovery phase.  All questions were answered.   Lucretia RoersLindsay C Ranveer Wahlstrom 06/12/2018, 12:04 PM

## 2018-06-12 NOTE — Progress Notes (Signed)
*  PRELIMINARY RESULTS* Echocardiogram 2D Echocardiogram has been performed with Definity.  Stacey DrainWhite, Kalima Saylor J 06/12/2018, 3:27 PM

## 2018-06-12 NOTE — ED Triage Notes (Signed)
PT c/o middle abdominal aching that started last night with nausea. PT denies any urinary symptoms, vomiting or diarrhea. PT states normal BM this am.

## 2018-06-12 NOTE — ED Provider Notes (Signed)
[facility] EMERGENCY DEPARTMENT Provider Note   CSN: [id number] Arrival date & time: 06/12/18  [id number]     History   Chief Complaint Chief Complaint  Patient presents with  . Abdominal Pain    HPI Dorothy Sandoval is a 82 y.o. female.  82yo female brought in from home with complaint of abdominal pain with nausea and dry heaves.  Patient reports generalized abdominal pain described as aching, constant, nothing makes her pain better or worse, radiates around to both sides of back and up into chest.  No relief with milk of magnesia at home. Associated with nausea and dry heaves, denies vomiting, changes in bowel or bladder habits.  Last bowel movement was this morning as described as normal.  Denies fevers, no known sick contacts.  No prior abdominal surgeries.  Denies chest pain or shortness of breath.     Past Medical History:  Diagnosis Date  . Hypertension     Patient Active Problem List   Diagnosis Date Noted  . Calculus of gallbladder with acute cholecystitis without obstruction 06/12/2018  . Primary osteoarthritis of left knee 04/27/2017    Past Surgical History:  Procedure Laterality Date  . REPLACEMENT TOTAL KNEE BILATERAL       OB History    Gravida      Para      Term      Preterm      AB      Living  2     SAB      TAB      Ectopic      Multiple      Live Births               Home Medications    Prior to Admission medications   Medication Sig Start Date End Date Taking? Authorizing Provider  aspirin EC 81 MG tablet Take 81 mg by mouth daily.   Yes [provider]  citalopram (CELEXA) 10 MG tablet Take 10 mg by mouth daily. 11/11/16  Yes [provider]  furosemide (LASIX) 20 MG tablet Take 20 mg by mouth daily. 04/01/16  Yes [provider]  levothyroxine (SYNTHROID) 50 MCG tablet Take 25 mcg by mouth daily. 04/23/16  Yes [provider]  metoprolol succinate (TOPROL-XL) 25 MG 24 hr tablet Take 25  mg by mouth daily.   Yes [provider]  omeprazole (PRILOSEC) 20 MG capsule Take 20 mg by mouth daily. 04/21/16  Yes [provider]  TRELEGY ELLIPTA 100-62.5-25 MCG/INH AEPB Inhale 1 puff into the lungs daily. 04/24/18  Yes [provider]  LORazepam (ATIVAN) 0.5 MG tablet Take 0.5 mg by mouth every 8 (eight) hours as needed. 04/03/16   [provider]    Family History Family History  Problem Relation Age of Onset  . Heart failure Father   . Diabetes Sister   . Diabetes Brother     Social History Social History   Tobacco Use  . Smoking status: Never Smoker  . Smokeless tobacco: Never Used  Substance Use Topics  . Alcohol use: No  . Drug use: No     Allergies   Meloxicam and Other   Review of Systems Review of Systems  Constitutional: Negative for chills and fever.  Respiratory: Negative for shortness of breath.   Cardiovascular: Negative for chest pain.  Gastrointestinal: Positive for abdominal pain and nausea. Negative for abdominal distention, blood in stool, constipation, diarrhea and vomiting.  Genitourinary: Negative for difficulty urinating,  dysuria, frequency and urgency.  Musculoskeletal: Positive for back pain.  Skin: Negative for rash.  Allergic/Immunologic: Negative for immunocompromised state.  Neurological: Negative for dizziness and weakness.  Hematological: Does not bruise/bleed easily.  Psychiatric/Behavioral: Negative for confusion.  All other systems reviewed and are negative.    Physical Exam Updated Vital Signs BP (!) 147/73   Pulse 88   Temp 98.2 F (36.8 C) (Oral)   Resp (!) 24   Ht 5\' 6"  (1.676 m)   Wt 99.8 kg   SpO2 95%   BMI 35.51 kg/m   Physical Exam Vitals signs and nursing note reviewed.  Constitutional:      General: She is not in acute distress.    Appearance: She is well-developed. She is not diaphoretic.  HENT:     Head: Normocephalic and atraumatic.     Mouth/Throat:     Mouth:  Mucous membranes are moist.  Cardiovascular:     Rate and Rhythm: Normal rate and regular rhythm.     Heart sounds: Normal heart sounds. No murmur.  Pulmonary:     Effort: Pulmonary effort is normal.  Abdominal:     Palpations: Abdomen is soft.     Tenderness: There is abdominal tenderness in the right lower quadrant and left lower quadrant. There is no right CVA tenderness, left CVA tenderness, guarding or rebound.  Skin:    General: Skin is warm and dry.  Neurological:     Mental Status: She is alert and oriented to person, place, and time.  Psychiatric:        Behavior: Behavior normal.      ED Treatments / Results  Labs (all labs ordered are listed, but only abnormal results are displayed) Labs Reviewed  CBC WITH DIFFERENTIAL/PLATELET - Abnormal; Notable for the following components:      Result Value   WBC 12.2 (*)    Hemoglobin 11.9 (*)    MCH 24.8 (*)    Neutro Abs 10.7 (*)    All other components within normal limits  COMPREHENSIVE METABOLIC PANEL - Abnormal; Notable for the following components:   Sodium 132 (*)    Glucose, Bld 146 (*)    All other components within normal limits  LIPASE, BLOOD  URINALYSIS, ROUTINE W REFLEX MICROSCOPIC    EKG EKG Interpretation  Date/Time:  Monday June 12 2018 08:43:59 EST Ventricular Rate:  78 PR Interval:    QRS Duration: 100 QT Interval:  374 QTC Calculation: 426 R Axis:   41 Text Interpretation:  Sinus rhythm Abnormal R-wave progression, early transition Inferior infarct, old No significant change since last tracing Confirmed by Linwood Dibbles [phone]) on 06/12/2018 8:46:16 AM   Radiology Ct Abdomen Pelvis W Contrast  Result Date: 06/12/2018 CLINICAL DATA:  82 year old with diffuse abdominal pain. Nausea and vomiting. EXAM: CT ABDOMEN AND PELVIS WITH CONTRAST TECHNIQUE: Multidetector CT imaging of the abdomen and pelvis was performed using the standard protocol following bolus administration of intravenous contrast.  CONTRAST:  ISOVUE-300 IOPAMIDOL (ISOVUE-300) INJECTION 61% COMPARISON:  None. FINDINGS: Lower chest: Volume loss in the right lower lobe associated with a hiatal hernia. No pleural effusions. Hepatobiliary: Cholelithiasis with small amount of pericholecystic edema or stranding. Evidence for a large 2.2 cm stone at the gallbladder base. High-density material within the gallbladder. Small amount of pericholecystic fluid. Mild intrahepatic biliary dilatation. Main portal venous system is patent. Extrahepatic bile duct is not dilated. Pancreas: Unremarkable. No pancreatic ductal dilatation or surrounding inflammatory changes. Spleen: Normal in size without focal  abnormality. Adrenals/Urinary Tract: Normal adrenal glands. Normal appearance of both kidneys without hydronephrosis. Stomach/Bowel: Large hiatal hernia containing majority of the stomach. No evidence for gastric dilatation or inflammation. Mild inflammatory changes near the duodenal bulb probably secondary to the gallbladder inflammation. Mild inflammation around the hepatic flexure probably secondary to the gallbladder inflammation. Appendix is not confidently identified but no inflammatory changes around the cecum or terminal ileum. Vascular/Lymphatic: Main visceral arteries are patent. Atherosclerotic calcifications in the aorta without aneurysm. Venous structures are unremarkable. No lymph node enlargement in the abdomen or pelvis. Reproductive: Uterus and bilateral adnexa are unremarkable. Other: Trace free fluid in the pelvis. Negative for free air. Periumbilical ventral hernia containing fat on sequence 4, image 63. Bilateral inguinal hernias containing fat. Musculoskeletal: Degenerative facet disease in the lumbar spine. IMPRESSION: 1. Cholelithiasis and evidence for acute cholecystitis. Small amount of pericholecystic fluid. Trace fluid in the pelvis. 2. Large hiatal hernia. 3. Uncomplicated ventral and inguinal hernias. Electronically Signed    By: Richarda OverlieAdam  Henn M.D.   On: 06/12/2018 10:55   Dg Chest Port 1 View  Result Date: 06/12/2018 CLINICAL DATA:  Acute cholecystitis. EXAM: PORTABLE CHEST 1 VIEW COMPARISON:  Chest x-ray dated May 02, 2018. FINDINGS: Stable cardiomegaly. Normal pulmonary vascularity. Unchanged bibasilar atelectasis. No focal consolidation, pleural effusion, or pneumothorax. Unchanged large hiatal hernia. No acute osseous abnormality. IMPRESSION: 1. Bibasilar atelectasis.  No active disease. 2. Large hiatal hernia. Electronically Signed   By: Obie DredgeWilliam T Derry M.D.   On: 06/12/2018 12:31   Koreas Abdomen Limited Ruq  Result Date: 06/12/2018 CLINICAL DATA:  Cholelithiasis. EXAM: ULTRASOUND ABDOMEN LIMITED RIGHT UPPER QUADRANT COMPARISON:  Abdominal CT from earlier today FINDINGS: Gallbladder: Two large calculi; no gallbladder wall calcification by CT. No focal tenderness or convincing wall thickening. Pericholecystic low-density deep to the gallbladder on prior CT; no visible pericholecystic edema. Common bile duct: Diameter: 4 mm Liver: No focal lesion identified. Within normal limits in parenchymal echogenicity. Portal vein is patent on color Doppler imaging with normal direction of blood flow towards the liver. IMPRESSION: Cholelithiasis without associated findings of acute cholecystitis. Given discrepancy with prior CT, consider HIDA scan. Electronically Signed   By: Marnee SpringJonathon  Watts M.D.   On: 06/12/2018 11:40    Procedures Procedures (including critical care time)  Medications Ordered in ED Medications  furosemide (LASIX) tablet 20 mg (has no administration in time range)  citalopram (CELEXA) tablet 10 mg (has no administration in time range)  LORazepam (ATIVAN) tablet 0.5 mg (has no administration in time range)  levothyroxine (SYNTHROID, LEVOTHROID) tablet 25 mcg (has no administration in time range)  pantoprazole (PROTONIX) EC tablet 40 mg (has no administration in time range)  enoxaparin (LOVENOX) injection 40  mg (has no administration in time range)  lactated ringers infusion (has no administration in time range)  ketorolac (TORADOL) 15 MG/ML injection 15 mg (has no administration in time range)  oxyCODONE (Oxy IR/ROXICODONE) immediate release tablet 5 mg (has no administration in time range)  morphine 2 MG/ML injection 2 mg (has no administration in time range)  diphenhydrAMINE (BENADRYL) 12.5 MG/5ML elixir 12.5 mg (has no administration in time range)    Or  diphenhydrAMINE (BENADRYL) injection 12.5 mg (has no administration in time range)  docusate sodium (COLACE) capsule 100 mg (has no administration in time range)  ondansetron (ZOFRAN-ODT) disintegrating tablet 4 mg (has no administration in time range)    Or  ondansetron (ZOFRAN) injection 4 mg (has no administration in time range)  simethicone (MYLICON) chewable tablet  40 mg (has no administration in time range)  metoprolol tartrate (LOPRESSOR) injection 5 mg (has no administration in time range)  cefoTEtan (CEFOTAN) 1 g in sodium chloride 0.9 % 100 mL IVPB (has no administration in time range)  cefTRIAXone (ROCEPHIN) 2 g in sodium chloride 0.9 % 100 mL IVPB (has no administration in time range)  metoprolol succinate (TOPROL-XL) 24 hr tablet 25 mg (has no administration in time range)  fluticasone furoate-vilanterol (BREO ELLIPTA) 100-25 MCG/INH 1 puff (has no administration in time range)    And  umeclidinium bromide (INCRUSE ELLIPTA) 62.5 MCG/INH 1 puff (has no administration in time range)  morphine 2 MG/ML injection 2 mg (2 mg Intravenous Given 06/12/18 0856)  ondansetron (ZOFRAN) injection 4 mg (4 mg Intravenous Given 06/12/18 0856)  sodium chloride 0.9 % bolus 500 mL (0 mLs Intravenous Stopped 06/12/18 0922)  iopamidol (ISOVUE-300) 61 % injection 100 mL (100 mLs Intravenous Contrast Given 06/12/18 1020)  morphine 2 MG/ML injection 2 mg (2 mg Intravenous Given 06/12/18 1144)     Initial Impression / Assessment and Plan / ED Course    I have reviewed the triage vital signs and the nursing notes.  Pertinent labs & imaging results that were available during my care of the patient were reviewed by me and considered in my medical decision making (see chart for details).  Clinical Course as of Jun 12 1233  Mon Jun 12, 2018  [id number] 82 year old female presents with complaint of abdominal pain with nausea and dry heaves.  On exam patient has generalized abdominal tenderness.  CBC shows mild leukocytosis at 12.2, CMP with mild hyponatremia with a sodium of 132.  Patient's lipase is within normal limits.  CT scan with concern for cholelithiasis. General surgery recommends [place], completed, general surgery to see patient.    [LM]    Clinical Course User Index [LM] Jeannie Fend, PA-C   Final Clinical Impressions(s) / ED Diagnoses   Final diagnoses:  Cholelithiases  Biliary calculus of other site without obstruction    ED Discharge Orders    None       Jeannie Fend, PA-C 06/12/18 1234    Linwood Dibbles, MD 06/13/18 [phone]

## 2018-06-13 ENCOUNTER — Encounter (HOSPITAL_COMMUNITY)
Admission: EM | Disposition: A | Discharge status: Home / Self Care | Payer: Self-pay | Source: Home / Self Care | Attending: Emergency Medicine

## 2018-06-13 ENCOUNTER — Observation Stay (HOSPITAL_COMMUNITY): Payer: Medicare Other | Admitting: Anesthesiology

## 2018-06-13 ENCOUNTER — Encounter (HOSPITAL_COMMUNITY): Payer: Self-pay

## 2018-06-13 DIAGNOSIS — Z7982 Long term (current) use of aspirin: Secondary | ICD-10-CM | POA: Diagnosis not present

## 2018-06-13 DIAGNOSIS — K8000 Calculus of gallbladder with acute cholecystitis without obstruction: Secondary | ICD-10-CM | POA: Diagnosis not present

## 2018-06-13 DIAGNOSIS — Z96653 Presence of artificial knee joint, bilateral: Secondary | ICD-10-CM | POA: Diagnosis not present

## 2018-06-13 DIAGNOSIS — I10 Essential (primary) hypertension: Secondary | ICD-10-CM | POA: Diagnosis not present

## 2018-06-13 HISTORY — PX: CHOLECYSTECTOMY: SHX55

## 2018-06-13 LAB — COMPREHENSIVE METABOLIC PANEL
ALT: 16 U/L (ref 0–44)
AST: 19 U/L (ref 15–41)
Albumin: 2.8 g/dL — ABNORMAL LOW (ref 3.5–5.0)
Alkaline Phosphatase: 61 U/L (ref 38–126)
Anion gap: 6 (ref 5–15)
BUN: 9 mg/dL (ref 8–23)
CHLORIDE: 100 mmol/L (ref 98–111)
CO2: 26 mmol/L (ref 22–32)
CREATININE: 0.69 mg/dL (ref 0.44–1.00)
Calcium: 8.6 mg/dL — ABNORMAL LOW (ref 8.9–10.3)
GFR calc Af Amer: >60 mL/min (ref >60)
Glucose, Bld: 133 mg/dL — ABNORMAL HIGH (ref 70–99)
Potassium: 4 mmol/L (ref 3.5–5.1)
Sodium: 132 mmol/L — ABNORMAL LOW (ref 135–145)
Total Bilirubin: 0.9 mg/dL (ref 0.3–1.2)
Total Protein: 5.7 g/dL — ABNORMAL LOW (ref 6.5–8.1)

## 2018-06-13 LAB — SURGICAL PCR SCREEN
MRSA, PCR: NEGATIVE
STAPHYLOCOCCUS AUREUS: NEGATIVE

## 2018-06-13 SURGERY — LAPAROSCOPIC CHOLECYSTECTOMY
Anesthesia: General | Site: Abdomen

## 2018-06-13 MED ORDER — ROCURONIUM BROMIDE 10 MG/ML (PF) SYRINGE
PREFILLED_SYRINGE | INTRAVENOUS | Status: AC
  Filled 2018-06-13: qty 10

## 2018-06-13 MED ORDER — HYDROMORPHONE HCL 1 MG/ML IJ SOLN: 0.2500 mg | INTRAMUSCULAR | Status: DC | PRN

## 2018-06-13 MED ORDER — ROCURONIUM BROMIDE 50 MG/5ML IV SOSY
PREFILLED_SYRINGE | INTRAVENOUS | Status: DC | PRN
  Administered 2018-06-13: 30 mg via INTRAVENOUS

## 2018-06-13 MED ORDER — SUCCINYLCHOLINE CHLORIDE 20 MG/ML IJ SOLN
INTRAMUSCULAR | Status: DC | PRN
  Administered 2018-06-13: 120 mg via INTRAVENOUS

## 2018-06-13 MED ORDER — OXYCODONE HCL 5 MG PO TABS: 5.0000 mg | ORAL_TABLET | ORAL | 0 refills | Status: DC | PRN

## 2018-06-13 MED ORDER — FENTANYL CITRATE (PF) 100 MCG/2ML IJ SOLN
INTRAMUSCULAR | Status: DC | PRN
  Administered 2018-06-13: 25 ug via INTRAVENOUS
  Administered 2018-06-13: 75 ug via INTRAVENOUS

## 2018-06-13 MED ORDER — LACTATED RINGERS IV SOLN
INTRAVENOUS | Status: DC
  Administered 2018-06-13: 13:00:00 via INTRAVENOUS

## 2018-06-13 MED ORDER — HYDROCODONE-ACETAMINOPHEN 7.5-325 MG PO TABS: 1.0000 | ORAL_TABLET | Freq: Once | ORAL | Status: DC | PRN

## 2018-06-13 MED ORDER — EPHEDRINE 5 MG/ML INJ
INTRAVENOUS | Status: AC
  Filled 2018-06-13: qty 10

## 2018-06-13 MED ORDER — DOCUSATE SODIUM 100 MG PO CAPS: 100.0000 mg | ORAL_CAPSULE | Freq: Two times a day (BID) | ORAL | 2 refills | Status: DC

## 2018-06-13 MED ORDER — PROPOFOL 10 MG/ML IV BOLUS
INTRAVENOUS | Status: DC | PRN
  Administered 2018-06-13: 80 mg via INTRAVENOUS

## 2018-06-13 MED ORDER — MEPERIDINE HCL 50 MG/ML IJ SOLN: 6.2500 mg | INTRAMUSCULAR | Status: DC | PRN

## 2018-06-13 MED ORDER — FENTANYL CITRATE (PF) 250 MCG/5ML IJ SOLN
INTRAMUSCULAR | Status: AC
  Filled 2018-06-13: qty 5

## 2018-06-13 MED ORDER — SUGAMMADEX SODIUM 200 MG/2ML IV SOLN
INTRAVENOUS | Status: AC
  Filled 2018-06-13: qty 2

## 2018-06-13 MED ORDER — KETOROLAC TROMETHAMINE 30 MG/ML IJ SOLN: 30.0000 mg | Freq: Once | INTRAMUSCULAR | Status: DC | PRN

## 2018-06-13 MED ORDER — ALBUTEROL SULFATE (2.5 MG/3ML) 0.083% IN NEBU: 3.0000 mL | INHALATION_SOLUTION | Freq: Four times a day (QID) | RESPIRATORY_TRACT | Status: DC | PRN

## 2018-06-13 MED ORDER — PHENYLEPHRINE HCL 10 MG/ML IJ SOLN
INTRAMUSCULAR | Status: DC | PRN
  Administered 2018-06-13 (×4): 80 ug via INTRAVENOUS

## 2018-06-13 MED ORDER — ONDANSETRON HCL 4 MG/2ML IJ SOLN: 4.0000 mg | Freq: Once | INTRAMUSCULAR | Status: DC | PRN

## 2018-06-13 MED ORDER — HEMOSTATIC AGENTS (NO CHARGE) OPTIME
TOPICAL | Status: DC | PRN
  Administered 2018-06-13 (×2): 1 via TOPICAL

## 2018-06-13 MED ORDER — OXYCODONE HCL 5 MG PO TABS
5.0000 mg | ORAL_TABLET | ORAL | Status: DC | PRN
  Administered 2018-06-13 (×2): 5 mg via ORAL
  Filled 2018-06-13 (×2): qty 1

## 2018-06-13 MED ORDER — SUGAMMADEX SODIUM 200 MG/2ML IV SOLN
INTRAVENOUS | Status: DC | PRN
  Administered 2018-06-13: 199.6 mg via INTRAVENOUS

## 2018-06-13 MED ORDER — BUPIVACAINE HCL (PF) 0.5 % IJ SOLN
INTRAMUSCULAR | Status: AC
  Filled 2018-06-13: qty 30

## 2018-06-13 MED ORDER — SUCCINYLCHOLINE CHLORIDE 200 MG/10ML IV SOSY
PREFILLED_SYRINGE | INTRAVENOUS | Status: AC
  Filled 2018-06-13: qty 10

## 2018-06-13 MED ORDER — EPHEDRINE SULFATE 50 MG/ML IJ SOLN
INTRAMUSCULAR | Status: DC | PRN
  Administered 2018-06-13: 15 mg via INTRAVENOUS

## 2018-06-13 MED ORDER — BUPIVACAINE HCL (PF) 0.5 % IJ SOLN
INTRAMUSCULAR | Status: DC | PRN
  Administered 2018-06-13: 20 mL

## 2018-06-13 MED ORDER — SODIUM CHLORIDE 0.9 % IV SOLN
INTRAVENOUS | Status: DC | PRN
  Administered 2018-06-13: 1 g via INTRAVENOUS

## 2018-06-13 MED ORDER — PROPOFOL 10 MG/ML IV BOLUS
INTRAVENOUS | Status: AC
  Filled 2018-06-13: qty 40

## 2018-06-13 MED ORDER — SODIUM CHLORIDE 0.9 % IR SOLN
Status: DC | PRN
  Administered 2018-06-13: 3000 mL

## 2018-06-13 MED ORDER — PHENYLEPHRINE 40 MCG/ML (10ML) SYRINGE FOR IV PUSH (FOR BLOOD PRESSURE SUPPORT)
PREFILLED_SYRINGE | INTRAVENOUS | Status: AC
  Filled 2018-06-13: qty 10

## 2018-06-13 MED ORDER — ONDANSETRON HCL 4 MG/2ML IJ SOLN
INTRAMUSCULAR | Status: AC
  Filled 2018-06-13: qty 2

## 2018-06-13 MED ORDER — SODIUM CHLORIDE 0.9 % IR SOLN
Status: DC | PRN
  Administered 2018-06-13: 1000 mL

## 2018-06-13 SURGICAL SUPPLY — 49 items
ADH SKN CLS APL DERMABOND .7 (GAUZE/BANDAGES/DRESSINGS) ×1
APL SRG 38 LTWT LNG FL B (MISCELLANEOUS) ×1
APPLICATOR ARISTA FLEXITIP XL (MISCELLANEOUS) ×2 IMPLANT
APPLIER CLIP ROT 10 11.4 M/L (STAPLE) ×2
APR CLP MED LRG 11.4X10 (STAPLE) ×1
BAG RETRIEVAL 10 (BASKET) ×1
BLADE SURG 15 STRL LF DISP TIS (BLADE) ×1 IMPLANT
BLADE SURG 15 STRL SS (BLADE) ×2
CHLORAPREP W/TINT 26ML (MISCELLANEOUS) ×2 IMPLANT
CLIP APPLIE ROT 10 11.4 M/L (STAPLE) ×1 IMPLANT
CLOTH BEACON ORANGE TIMEOUT ST (SAFETY) ×2 IMPLANT
COVER LIGHT HANDLE STERIS (MISCELLANEOUS) ×4 IMPLANT
COVER WAND RF STERILE (DRAPES) ×2 IMPLANT
DECANTER SPIKE VIAL GLASS SM (MISCELLANEOUS) ×2 IMPLANT
DERMABOND ADVANCED (GAUZE/BANDAGES/DRESSINGS) ×1
DERMABOND ADVANCED .7 DNX12 (GAUZE/BANDAGES/DRESSINGS) ×1 IMPLANT
ELECT REM PT RETURN 9FT ADLT (ELECTROSURGICAL) ×2
ELECTRODE REM PT RTRN 9FT ADLT (ELECTROSURGICAL) ×1 IMPLANT
FILTER SMOKE EVAC LAPAROSHD (FILTER) ×2 IMPLANT
GLOVE BIO SURGEON STRL SZ 6.5 (GLOVE) ×2 IMPLANT
GLOVE BIOGEL PI IND STRL 6.5 (GLOVE) ×1 IMPLANT
GLOVE BIOGEL PI IND STRL 7.0 (GLOVE) ×3 IMPLANT
GLOVE BIOGEL PI INDICATOR 6.5 (GLOVE) ×1
GLOVE BIOGEL PI INDICATOR 7.0 (GLOVE) ×3
GLOVE ECLIPSE 6.5 STRL STRAW (GLOVE) ×4 IMPLANT
GOWN STRL REUS W/TWL LRG LVL3 (GOWN DISPOSABLE) ×6 IMPLANT
HEMOSTAT ARISTA ABSORB 3G PWDR (MISCELLANEOUS) ×2 IMPLANT
HEMOSTAT SNOW SURGICEL 2X4 (HEMOSTASIS) ×2 IMPLANT
INST SET LAPROSCOPIC AP (KITS) ×2 IMPLANT
IV NS IRRIG 3000ML ARTHROMATIC (IV SOLUTION) ×2 IMPLANT
KIT TURNOVER KIT A (KITS) ×2 IMPLANT
MANIFOLD NEPTUNE II (INSTRUMENTS) ×2 IMPLANT
NEEDLE INSUFFLATION 14GA 120MM (NEEDLE) ×2 IMPLANT
NS IRRIG 1000ML POUR BTL (IV SOLUTION) ×2 IMPLANT
PACK LAP CHOLE LZT030E (CUSTOM PROCEDURE TRAY) ×2 IMPLANT
PAD ARMBOARD 7.5X6 YLW CONV (MISCELLANEOUS) ×2 IMPLANT
SET BASIN LINEN APH (SET/KITS/TRAYS/PACK) ×2 IMPLANT
SET TUBE IRRIG SUCTION NO TIP (IRRIGATION / IRRIGATOR) ×2 IMPLANT
SLEEVE ENDOPATH XCEL 5M (ENDOMECHANICALS) ×2 IMPLANT
SUT MNCRL AB 4-0 PS2 18 (SUTURE) ×2 IMPLANT
SUT VICRYL 0 UR6 27IN ABS (SUTURE) ×2 IMPLANT
SYS BAG RETRIEVAL 10MM (BASKET) ×1
SYSTEM BAG RETRIEVAL 10MM (BASKET) ×1 IMPLANT
TROCAR ENDO BLADELESS 11MM (ENDOMECHANICALS) ×2 IMPLANT
TROCAR XCEL NON-BLD 5MMX100MML (ENDOMECHANICALS) ×2 IMPLANT
TROCAR XCEL UNIV SLVE 11M 100M (ENDOMECHANICALS) ×2 IMPLANT
TUBE CONNECTING 12X1/4 (SUCTIONS) ×2 IMPLANT
TUBING INSUFFLATION (TUBING) ×2 IMPLANT
WARMER LAPAROSCOPE (MISCELLANEOUS) ×2 IMPLANT

## 2018-06-13 NOTE — Progress Notes (Signed)
Transported from PACU on bed, alert and oriented, family at bedside.  Four lap choli sites dry and intact with liquid glue.  BP  105/34  Pulse 80.  Dr. Henreitta LeberBridges aware.  Denies pain and nausea.

## 2018-06-13 NOTE — Anesthesia Postprocedure Evaluation (Signed)
Anesthesia Post Note  Patient: Dorothy NeedleRebecca L Deterding  Procedure(s) Performed: LAPAROSCOPIC CHOLECYSTECTOMY (N/A Abdomen)  Patient location during evaluation: PACU Anesthesia Type: General Level of consciousness: awake and alert and oriented Pain management: pain level controlled Vital Signs Assessment: post-procedure vital signs reviewed and stable Respiratory status: spontaneous breathing and patient connected to nasal cannula oxygen Cardiovascular status: stable Postop Assessment: no apparent nausea or vomiting Anesthetic complications: no     Last Vitals:  Vitals:   06/13/18 0915 06/13/18 0930  BP: (!) 82/40 (!) 118/51  Pulse: 88   Resp: 20   Temp:    SpO2:      Last Pain:  Vitals:   06/13/18 0930  TempSrc:   PainSc: 0-No pain                 [name] A

## 2018-06-13 NOTE — Progress Notes (Signed)
Walked with walker and standby about 100 feet.  Stated felt a little woozy but that was from her inner ear problem.  Pain rated a 4 and received oxy 5 mg. Ate roll and peaches and drank sprite.  Said she might try ice cream.  Denies nausea.    Family at bedside.

## 2018-06-13 NOTE — Progress Notes (Signed)
IV removed.  Denies nausea and has pain rated a 4.  Will review discharge instructions with patient and daughter.  Daughter to drive home

## 2018-06-13 NOTE — Anesthesia Preprocedure Evaluation (Signed)
Anesthesia Evaluation  Patient identified by MRN, date of birth, ID band Patient awake    Reviewed: Allergy & Precautions, H&P , NPO status , Patient's Chart, lab work & pertinent test results  Airway Mallampati: II  TM Distance: >3 FB Neck ROM: full    Dental no notable dental hx.    Pulmonary neg pulmonary ROS,    Pulmonary exam normal breath sounds clear to auscultation       Cardiovascular Exercise Tolerance: Good hypertension, negative cardio ROS   Rhythm:regular Rate:Normal  ECHO: Left ventricle: The cavity size was normal. Systolic function was   vigorous. The estimated ejection fraction was in the range of 65%   to 70%.   Neuro/Psych negative neurological ROS  negative psych ROS   GI/Hepatic negative GI ROS, Neg liver ROS,   Endo/Other  negative endocrine ROS  Renal/GU negative Renal ROS  negative genitourinary   Musculoskeletal   Abdominal   Peds  Hematology negative hematology ROS (+)   Anesthesia Other Findings   Reproductive/Obstetrics negative OB ROS                             Anesthesia Physical Anesthesia Plan  ASA: III  Anesthesia Plan: General   Post-op Pain Management:    Induction:   PONV Risk Score and Plan:   Airway Management Planned:   Additional Equipment:   Intra-op Plan:   Post-operative Plan:   Informed Consent: I have reviewed the patients History and Physical, chart, labs and discussed the procedure including the risks, benefits and alternatives for the proposed anesthesia with the patient or authorized representative who has indicated his/her understanding and acceptance.   Dental Advisory Given  Plan Discussed with: CRNA  Anesthesia Plan Comments:         Anesthesia Quick Evaluation

## 2018-06-13 NOTE — Transfer of Care (Signed)
Immediate Anesthesia Transfer of Care Note  Patient: Dorothy Sandoval  Procedure(s) Performed: LAPAROSCOPIC CHOLECYSTECTOMY (N/A Abdomen)  Patient Location: PACU  Anesthesia Type:General  Level of Consciousness: drowsy and patient cooperative  Airway & Oxygen Therapy: Patient Spontanous Breathing and Patient connected to face mask oxygen  Post-op Assessment: Report given to RN and Post -op Vital signs reviewed and stable  Post vital signs: Reviewed and stable  Last Vitals:  Vitals Value Taken Time  BP 110/43 06/13/2018  9:02 AM  Temp    Pulse 94 06/13/2018  9:06 AM  Resp 28 06/13/2018  9:06 AM  SpO2 99 % 06/13/2018  9:06 AM  Vitals shown include unvalidated device data.  Last Pain:  Vitals:   06/13/18 0631  TempSrc: Oral  PainSc: 0-No pain      Patients Stated Pain Goal: 3 (06/12/18 1655)  Complications: No apparent anesthesia complications

## 2018-06-13 NOTE — Discharge Instructions (Signed)
Discharge Laparoscopic Surgery Instructions: ° °Common Complaints: °Right shoulder pain is common after laparoscopic surgery. This is secondary to the gas used in the surgery being trapped under the diaphragm.  °Walk to help your body absorb the gas. This will improve in a few days. °Pain at the port sites are common, especially the larger port sites. This will improve with time.  °Some nausea is common and poor appetite. The main goal is to stay hydrated the first few days after surgery.  ° °Diet/ Activity: °Diet as tolerated. You may not have an appetite, but it is important to stay hydrated. Drink 64 ounces of water a day. Your appetite will return with time.  °Shower per your regular routine daily.  Do not take hot showers. Take warm showers that are less than 10 minutes. °Rest and listen to your body, but do not remain in bed all day.  °Walk everyday for at least 15-20 minutes. Deep cough and move around every 1-2 hours in the first few days after surgery.  °Do not lift > 10 lbs, perform excessive bending, pushing, pulling, squatting for 1-2 weeks after surgery.  °Do not pick at the dermabond glue on your incision sites.  This glue film will remain in place for 1-2 weeks and will start to peel off.  °Do not place lotions or balms on your incision unless instructed to specifically by Dr. Yarimar Lavis.  ° °Medication: °Take tylenol and ibuprofen as needed for pain control, alternating every 4-6 hours.  °Example:  °Tylenol 1000mg @ 6am, 12noon, 6pm, 12midnight (Do not exceed 4000mg of tylenol a day). Ibuprofen 800mg @ 9am, 3pm, 9pm, 3am (Do not exceed 3600mg of ibuprofen a day).  °Take Roxicodone for breakthrough pain every 4 hours.  °Take Colace for constipation related to narcotic pain medication. If you do not have a bowel movement in 2 days, take Miralax over the counter.  °Drink plenty of water to also prevent constipation.  ° °Contact Information: °If you have questions or concerns, please call our office,  [phone], Monday- Thursday 8AM-5PM and Friday 8AM-12Noon.  °If it is after hours or on the weekend, please call [facility] Main [facility], [phone], and ask to speak to the surgeon on call for Dr. Meeka Cartelli at [place].  ° °Laparoscopic Cholecystectomy, Care After °This sheet gives you information about how to care for yourself after your procedure. Your doctor may also give you more specific instructions. If you have problems or questions, contact your doctor. °Follow these instructions at home: °Care for cuts from surgery (incisions) ° °· Follow instructions from your doctor about how to take care of your cuts from surgery. Make sure you: °? Wash your hands with soap and water before you change your bandage (dressing). If you cannot use soap and water, use hand sanitizer. °? Change your bandage as told by your doctor. °? Leave stitches (sutures), skin glue, or skin tape (adhesive) strips in place. They may need to stay in place for 2 weeks or longer. If tape strips get loose and curl up, you may trim the loose edges. Do not remove tape strips completely unless your doctor says it is okay. °· Do not take baths, swim, or use a hot tub until your doctor says it is okay.  °· You may shower. °· Check your surgical cut area every day for signs of infection. Check for: °? More redness, swelling, or pain. °? More fluid or blood. °? Warmth. °? Pus or a bad smell. °Activity °· Do not   drive or use heavy machinery while taking prescription pain medicine. °· Do not lift anything that is heavier than 10 lb (4.5 kg) until your doctor says it is okay. °· Do not play contact sports until your doctor says it is okay. °· Do not drive for 24 hours if you were given a medicine to help you relax (sedative). °· Rest as needed. Do not return to work or school until your doctor says it is okay. °General instructions °· Take over-the-counter and prescription medicines only as told by your doctor. °· To prevent or treat constipation while  you are taking prescription pain medicine, your doctor may recommend that you: °? Drink enough fluid to keep your pee (urine) clear or pale yellow. °? Take over-the-counter or prescription medicines. °? Eat foods that are high in fiber, such as fresh fruits and vegetables, whole grains, and beans. °? Limit foods that are high in fat and processed sugars, such as fried and sweet foods. °Contact a doctor if: °· You develop a rash. °· You have more redness, swelling, or pain around your surgical cuts. °· You have more fluid or blood coming from your surgical cuts. °· Your surgical cuts feel warm to the touch. °· You have pus or a bad smell coming from your surgical cuts. °· You have a fever. °· One or more of your surgical cuts breaks open. °Get help right away if: °· You have trouble breathing. °· You have chest pain. °· You have pain that is getting worse in your shoulders. °· You faint or feel dizzy when you stand. °· You have very bad pain in your belly (abdomen). °· You are sick to your stomach (nauseous) for more than one day. °· You have throwing up (vomiting) that lasts for more than one day. °· You have leg pain. °This information is not intended to replace advice given to you by your health care provider. Make sure you discuss any questions you have with your health care provider. °Document Released: 03/09/2008 Document Revised: 12/20/2015 Document Reviewed: 11/17/2015 °Elsevier Interactive Patient Education © 2019 [employer] ° ° °

## 2018-06-13 NOTE — Op Note (Signed)
Operative Note   Preoperative Diagnosis: Acute cholecystitis    Postoperative Diagnosis: Same   Procedure(s) Performed: Laparoscopic cholecystectomy   Surgeon: Lillia AbedLindsay C. Henreitta LeberBridges, MD   Assistants: No qualified resident was available   Anesthesia: General endotracheal   Anesthesiologist: Shona NeedlesWynn, Vander M, MD    Specimens: Gallbladder    Estimated Blood Loss: Minimal    Blood Replacement: None    Complications: None    Operative Findings: Inflamed, infected gallbladder with significant edema    Procedure: The patient was taken to the operating room and placed supine. General endotracheal anesthesia was induced. Intravenous antibiotics were administered per protocol. An orogastric tube positioned to decompress the stomach. The abdomen was prepared and draped in the usual sterile fashion.    A supraumbilical incision was made and a Veress technique was utilized to achieve pneumoperitoneum to 15 mmHg with carbon dioxide. A 11 mm optiview port was placed through the supraumbilical region, and a 10 mm 0-degree operative laparoscope was introduced. The area underlying the trocar and Veress needle were inspected and without evidence of injury.  Remaining trocars were placed under direct vision. Two 5 mm ports were placed in the right abdomen, between the anterior axillary and midclavicular line.  A final 11 mm port was placed through the mid-epigastrium, near the falciform ligament.    The gallbladder was very edematous and had significant rind.  The gallbladder was decompressed with cautery and the bile was removed with suction.  This allowed for the fundus to be elevated cephalad and the infundibulum was retracted to the patient's right. The gallbladder/cystic duct junction was skeletonized. A small vessel/ stranding was noted between  The cystic duct and a larger vessel posteriorly. Given concern that this larger vessel could be a hepatic branch, further dissection of the gallbladder was  performed to fully skeletonize the structures from the fatty edematous mesentery.  Ultimately, the cystic artery noted in the triangle of Calot and was also skeletonized.  We then continued liberal medial and lateral dissection until the critical view of safety was achieved.     The cystic duct was triply clipped and divided. The cystic artery was doubly clipped and divided, ensuring that it was going straight into the gallbladder and not tracking posteriorly off the larger branch that was noted.  A small bridging strand between the cystic duct/ cystic artery was doubly clipped and divided as it looked vascular in nature. The gallbladder was then dissected from the liver bed with electrocautery. The specimen was placed in an Endopouch and was retrieved through the epigastric site.   Final inspection revealed acceptable hemostasis. Surgical Jamelle HaringSnow and Arista were placed in the gallbladder bed. Trocars were removed and pneumoperitoneum was released. 0 Vicryl fascial sutures were used to close the epigastric port site and the umbilical site was too small to need to close.  Skin incisions were closed with 4-0 Monocryl subcuticular sutures and Dermabond. The patient was awakened from anesthesia and extubated without complication.    Algis GreenhouseLindsay Julio Storr, MD [facility] [address] [place], [place] [id number] [phone] (office)

## 2018-06-13 NOTE — Discharge Summary (Signed)
Physician Discharge Summary  Patient ID: Dorothy Sandoval MRN: [id number] DOB/AGE: [birth year] 82 y.o.  Admit date: 06/12/2018 Discharge date: 06/14/2018  Admission Diagnoses:  Discharge Diagnoses:  Active Problems:   Calculus of gallbladder with acute cholecystitis without obstruction   Discharged Condition: good  Hospital Course: Dorothy Sandoval is a 82 yo with acute cholecystitis that was admitted to the hospital and had her gallbladder removed the following day. She did well post operatively and was tolerating her diet and had adequate pain control. She wanted to go home, and felt ready.    Consults: None  Treatments: IV hydration, antibiotics: ceftriaxone and surgery: Laparoscopic Cholecystectomy 06/13/2018  Discharge Exam: Blood pressure 100/78, pulse 84, temperature 98.2 F (36.8 C), temperature source Oral, resp. rate 16, height 5\' 6"  (1.676 m), weight 220 lb (99.8 kg), SpO2 94 %.   Disposition:    Discharge Instructions    Call MD for:  difficulty breathing, headache or visual disturbances   Complete by:  As directed    Call MD for:  extreme fatigue   Complete by:  As directed    Call MD for:  persistant dizziness or light-headedness   Complete by:  As directed    Call MD for:  persistant nausea and vomiting   Complete by:  As directed    Call MD for:  redness, tenderness, or signs of infection (pain, swelling, redness, odor or green/yellow discharge around incision site)   Complete by:  As directed    Call MD for:  severe uncontrolled pain   Complete by:  As directed    Call MD for:  temperature >100.4   Complete by:  As directed    Diet - low sodium heart healthy   Complete by:  As directed    Driving Restrictions   Complete by:  As directed    No driving until off narcotics and can slam on breaks and turn wheel easily.   Increase activity slowly   Complete by:  As directed      Allergies as of 06/13/2018      Reactions   Meloxicam Diarrhea, Other (See  Comments)   WEAKNESS   Other Other (See Comments)   MODERATE TO SEVERE BRUISING. USE PAPER TAPE ONLY.      Medication List    TAKE these medications   albuterol 108 (90 Base) MCG/ACT inhaler Commonly known as:  PROVENTIL HFA;VENTOLIN HFA Inhale 1-2 puffs into the lungs every 6 (six) hours as needed for wheezing or shortness of breath.   aspirin EC 81 MG tablet Take 81 mg by mouth daily.   citalopram 10 MG tablet Commonly known as:  CELEXA Take 10 mg by mouth daily.   docusate sodium 100 MG capsule Commonly known as:  COLACE Take 1 capsule (100 mg total) by mouth 2 (two) times daily.   furosemide 20 MG tablet Commonly known as:  LASIX Take 20 mg by mouth daily.   LORazepam 0.5 MG tablet Commonly known as:  ATIVAN Take 0.5 mg by mouth every 8 (eight) hours as needed for anxiety.   metoprolol succinate 25 MG 24 hr tablet Commonly known as:  TOPROL-XL Take 25 mg by mouth daily.   omeprazole 20 MG capsule Commonly known as:  PRILOSEC Take 20 mg by mouth daily.   oxyCODONE 5 MG immediate release tablet Commonly known as:  Oxy IR/ROXICODONE Take 1 tablet (5 mg total) by mouth every 4 (four) hours as needed for severe pain or breakthrough pain.   SYNTHROID  50 MCG tablet Generic drug:  levothyroxine Take 25 mcg by mouth daily.   TRELEGY ELLIPTA 100-62.5-25 MCG/INH Aepb Generic drug:  Fluticasone-Umeclidin-Vilant Inhale 1 puff into the lungs daily.      Follow-up Information    Lucretia Roers,  C, MD In 2 weeks.   Specialty:  General Surgery Contact information: [address] [place] [address] [phone]           Signed: Lucretia Roers C  06/14/2018, 11:45 AM

## 2018-06-13 NOTE — Anesthesia Procedure Notes (Signed)
Procedure Name: Intubation Date/Time: 06/13/2018 7:33 AM Performed by: Andree Elk,  A, CRNA Pre-anesthesia Checklist: Patient identified, Patient being monitored, Timeout performed, Emergency Drugs available and Suction available Patient Re-evaluated:Patient Re-evaluated prior to induction Oxygen Delivery Method: Circle system utilized Preoxygenation: Pre-oxygenation with 100% oxygen Induction Type: IV induction Ventilation: Mask ventilation without difficulty Laryngoscope Size: Mac and 3 Grade View: Grade I Tube type: Oral Tube size: 7.0 mm Number of attempts: 1 Airway Equipment and Method: Stylet Placement Confirmation: ETT inserted through vocal cords under direct vision,  positive ETCO2 and breath sounds checked- equal and bilateral Secured at: 21 cm Tube secured with: Tape Dental Injury: Teeth and Oropharynx as per pre-operative assessment

## 2018-06-13 NOTE — Interval H&P Note (Signed)
History and Physical Interval Note:  06/13/2018 7:14 AM  Dorothy Sandoval  has presented today for surgery, with the diagnosis of acute cholecystitis  The various methods of treatment have been discussed with the patient and family. After consideration of risks, benefits and other options for treatment, the patient has consented to  Procedure(s): LAPAROSCOPIC CHOLECYSTECTOMY (N/A) as a surgical intervention .  The patient's history has been reviewed, patient examined, no change in status, stable for surgery.  I have reviewed the patient's chart and labs.  Questions were answered to the patient's satisfaction.    No questions. EF 70% vigorous systolic function.   Lucretia RoersLindsay C Rashawd Laskaris

## 2018-06-15 ENCOUNTER — Encounter (HOSPITAL_COMMUNITY): Payer: Self-pay | Admitting: General Surgery

## 2018-06-27 ENCOUNTER — Encounter: Payer: Self-pay | Admitting: General Surgery

## 2018-06-27 ENCOUNTER — Ambulatory Visit (INDEPENDENT_AMBULATORY_CARE_PROVIDER_SITE_OTHER): Payer: Self-pay | Admitting: General Surgery

## 2018-06-27 VITALS — BP 160/71 | HR 75 | Temp 96.9°F | Resp 18 | Wt 208.2 lb

## 2018-06-27 DIAGNOSIS — K8000 Calculus of gallbladder with acute cholecystitis without obstruction: Secondary | ICD-10-CM

## 2018-06-27 NOTE — Patient Instructions (Signed)
Activity and diet as tolerated.    

## 2018-06-27 NOTE — Progress Notes (Signed)
[facility] Note   HPI:  83 y.o. Female presents to clinic for post-op follow-up evaluation after a laparoscopic cholecystectomy. Patient reports she is doing well. She has no pain, no nausea. She is feeling better.  Review of Systems:  No fever or chills Tolerating diet  All other review of systems: otherwise negative   Pathology: Diagnosis Gallbladder - ACUTE CHOLECYSTITIS AND CHOLELITHIASIS  Vital Signs:  BP (!) 160/71 (BP Location: Left Arm, Patient Position: Sitting, Cuff Size: Normal)   Pulse 75   Temp (!) 96.9 F (36.1 C) (Temporal)   Resp 18   Wt 208 lb 3.2 oz (94.4 kg)   BMI 33.60 kg/m    Physical Exam:  Physical Exam Vitals signs reviewed.  Constitutional:      Appearance: Normal appearance.  Cardiovascular:     Rate and Rhythm: Normal rate and regular rhythm.  Pulmonary:     Effort: Pulmonary effort is normal.  Abdominal:     General: There is no distension.     Palpations: Abdomen is soft.     Tenderness: There is no abdominal tenderness.     Comments: Port sites healing, no erythema or drainage  Skin:    General: Skin is warm and dry.  Neurological:     Mental Status: She is alert.  Psychiatric:        Mood and Affect: Mood normal.        Behavior: Behavior normal.    Assessment:  84 y.o. yo Female s/p laparoscopic cholecystectomy for cholecystitis. doing well.  Plan:  -  PRN Follow up   - Diet and activity as tolerated   All of the above recommendations were discussed with the patient and patient's family, and all of patient's and family's questions were answered to their expressed satisfaction.  Algis Greenhouse, MD [facility] [address] [place], [place] [address] [phone] (office)

## 2019-12-10 ENCOUNTER — Other Ambulatory Visit: Payer: Self-pay | Admitting: *Deleted

## 2019-12-10 DIAGNOSIS — I739 Peripheral vascular disease, unspecified: Secondary | ICD-10-CM

## 2019-12-19 ENCOUNTER — Encounter: Payer: Self-pay | Admitting: Vascular Surgery

## 2019-12-19 ENCOUNTER — Other Ambulatory Visit: Payer: Self-pay

## 2019-12-19 ENCOUNTER — Ambulatory Visit (HOSPITAL_COMMUNITY)
Admission: RE | Admit: 2019-12-19 | Discharge: 2019-12-19 | Disposition: A | Discharge status: Home / Self Care | Payer: Medicare Other | Source: Ambulatory Visit | Attending: Vascular Surgery | Admitting: Vascular Surgery

## 2019-12-19 ENCOUNTER — Ambulatory Visit (INDEPENDENT_AMBULATORY_CARE_PROVIDER_SITE_OTHER): Payer: Medicare Other | Admitting: Vascular Surgery

## 2019-12-19 VITALS — BP 139/74 | HR 62 | Temp 97.9°F | Resp 20 | Ht 66.0 in | Wt 227.0 lb

## 2019-12-19 DIAGNOSIS — I739 Peripheral vascular disease, unspecified: Secondary | ICD-10-CM | POA: Diagnosis not present

## 2019-12-19 DIAGNOSIS — M79672 Pain in left foot: Secondary | ICD-10-CM

## 2019-12-19 NOTE — Progress Notes (Signed)
REASON FOR CONSULT:    Left leg claudication the consult is requested by Dr. Gilman Schmidt  ASSESSMENT & PLAN:   LEFT FOOT PAIN: I have reassured the patient that I do not think her foot pain is related to peripheral vascular disease.  She has palpable pedal pulses, and normal ABI, and normal toe pressures bilaterally.  She has a high arch and perhaps she has plantar fasciitis or arthritis in her foot which explains her pain.  I do not think any further arterial work-up is indicated.  [name], MD Office: [phone]   HPI:   Dorothy Sandoval is a pleasant 84 y.o. female, who was referred with claudication of the left leg.  I have reviewed the records from the referring office.  The patient was seen on 10/26/2019 for a routine follow-up visit and medication refills.  The patient has essential hypertension which has been under good control.  Patient also has acquired hypothyroidism vitamin D deficiency and vitamin B12 deficiency.  In addition she has COPD.  She was complaining of some left leg claudication is sent for vascular consultation.  On my history I do not get any history of claudication, rest pain, or nonhealing ulcers.  She describes pain in her left foot which comes and goes.  This is in the plantar aspect of her foot and also on the dorsum of her foot.  Tylenol helps the pain some.  There are no aggravating factors that she is aware of.  She is had bilateral knee replacements in the past.  Her only real risk factors for peripheral vascular disease are essential hypertension and a family history of premature cardiovascular disease.  She denies any history of diabetes, hypercholesterolemia, or tobacco use.  Past Medical History:  Diagnosis Date  . Hypertension   . Thyroid disease     Family History  Problem Relation Age of Onset  . Heart failure Father   . Diabetes Sister   . Diabetes Brother     SOCIAL HISTORY: Social History   Socioeconomic History  . Marital  status: Married    Spouse name: Not on file  . Number of children: Not on file  . Years of education: Not on file  . Highest education level: Not on file  Occupational History  . Not on file  Tobacco Use  . Smoking status: Never Smoker  . Smokeless tobacco: Never Used  Vaping Use  . Vaping Use: Never used  Substance and Sexual Activity  . Alcohol use: No  . Drug use: No  . Sexual activity: Not on file  Other Topics Concern  . Not on file  Social History Narrative  . Not on file   Social Determinants of Health   Financial Resource Strain:   . Difficulty of Paying Living Expenses:   Food Insecurity:   . Worried About Programme researcher, broadcasting/film/video in the Last Year:   . Barista in the Last Year:   Transportation Needs:   . Freight forwarder (Medical):   Marland Kitchen Lack of Transportation (Non-Medical):   Physical Activity:   . Days of Exercise per Week:   . Minutes of Exercise per Session:   Stress:   . Feeling of Stress :   Social Connections:   . Frequency of Communication with Friends and Family:   . Frequency of Social Gatherings with Friends and Family:   . Attends Religious Services:   . Active Member of Clubs or Organizations:   . Attends  Club or Organization Meetings:   Marland Kitchen Marital Status:   Intimate Partner Violence:   . Fear of Current or Ex-Partner:   . Emotionally Abused:   Marland Kitchen Physically Abused:   . Sexually Abused:     Allergies  Allergen Reactions  . Meloxicam Diarrhea and Other (See Comments)    WEAKNESS  . Other Other (See Comments)    MODERATE TO SEVERE BRUISING. USE PAPER TAPE ONLY.    Current Outpatient Medications  Medication Sig Dispense Refill  . albuterol (PROVENTIL HFA;VENTOLIN HFA) 108 (90 Base) MCG/ACT inhaler Inhale 1-2 puffs into the lungs every 6 (six) hours as needed for wheezing or shortness of breath.    Marland Kitchen aspirin EC 81 MG tablet Take 81 mg by mouth daily.    . citalopram (CELEXA) 10 MG tablet Take 10 mg by mouth daily.    . furosemide  (LASIX) 20 MG tablet Take 20 mg by mouth daily.    Marland Kitchen levothyroxine (SYNTHROID) 50 MCG tablet Take 25 mcg by mouth daily.    Marland Kitchen LORazepam (ATIVAN) 0.5 MG tablet Take 0.5 mg by mouth every 8 (eight) hours as needed for anxiety.     . meclizine (ANTIVERT) 25 MG tablet Take 25 mg by mouth 3 (three) times daily as needed for dizziness.    . metoprolol succinate (TOPROL-XL) 50 MG 24 hr tablet Take 50 mg by mouth daily.    Marland Kitchen omeprazole (PRILOSEC) 20 MG capsule Take 20 mg by mouth daily.    . TRELEGY ELLIPTA 100-62.5-25 MCG/INH AEPB Inhale 1 puff into the lungs daily.  3   No current facility-administered medications for this visit.    REVIEW OF SYSTEMS:  [X]  denotes positive finding, [ ]  denotes negative finding Cardiac  Comments:  Chest pain or chest pressure:    Shortness of breath upon exertion:    Short of breath when lying flat:    Irregular heart rhythm:        Vascular    Pain in calf, thigh, or hip brought on by ambulation:    Pain in feet at night that wakes you up from your sleep:     Blood clot in your veins:    Leg swelling:  x       Pulmonary    Oxygen at home:    Productive cough:     Wheezing:         Neurologic    Sudden weakness in arms or legs:     Sudden numbness in arms or legs:     Sudden onset of difficulty speaking or slurred speech:    Temporary loss of vision in one eye:     Problems with dizziness:         Gastrointestinal    Blood in stool:     Vomited blood:         Genitourinary    Burning when urinating:     Blood in urine:        Psychiatric    Major depression:         Hematologic    Bleeding problems:    Problems with blood clotting too easily:        Skin    Rashes or ulcers:        Constitutional    Fever or chills:     PHYSICAL EXAM:   Vitals:   12/19/19 1315  BP: 139/74  Pulse: 62  Resp: 20  Temp: 97.9 F (36.6 C)  SpO2: 92%  Weight:  227 lb (103 kg)  Height: 5\' 6"  (1.676 m)   Body mass index is 36.64  kg/m.  GENERAL: The patient is a well-nourished female, in no acute distress. The vital signs are documented above. CARDIAC: There is a regular rate and rhythm.  VASCULAR: I do not detect carotid bruits. On the right side she has a palpable femoral, popliteal, dorsalis pedis, posterior tibial pulse. On the left side she has a palpable femoral, popliteal, and posterior tibial pulse. She has no significant lower extremity swelling. PULMONARY: There is good air exchange bilaterally without wheezing or rales. ABDOMEN: Soft and non-tender with normal pitched bowel sounds.  MUSCULOSKELETAL: There are no major deformities or cyanosis. NEUROLOGIC: No focal weakness or paresthesias are detected. SKIN: There are no ulcers or rashes noted. PSYCHIATRIC: The patient has a normal affect.  DATA:    ARTERIAL DOPPLER STUDY: I have independently interpreted her arterial Doppler study today.  On the right side there is a triphasic dorsalis pedis and posterior tibial signal.  ABI is 100%.  Toe pressure is 152 mmHg.  On the left side there is a triphasic dorsalis pedis and posterior tibial signal.  ABI is 100%.  Toe pressures 161 mmHg.

## 2020-03-08 IMAGING — CT CT ABD-PELV W/ CM
2 of 5 series · 16 of 46 positions shown, 18 images · IV contrast (iopamidol)
Comparison: None.

CLINICAL DATA: 82-year-old with diffuse abdominal pain. Nausea and
vomiting.

EXAM:
CT ABDOMEN AND PELVIS WITH CONTRAST
TECHNIQUE: Multidetector CT imaging of the abdomen and pelvis was performed
using the standard protocol following bolus administration of
intravenous contrast.
CONTRAST:  100mL KIQEVB-XQQ IOPAMIDOL (KIQEVB-XQQ) INJECTION 61%

[Series 4: axial st · axial · 1.27mm/px · z∈[+849,+1284]mm · 13 of 99 slices shown, 15 images]
[im 6/99  soft-tissue]
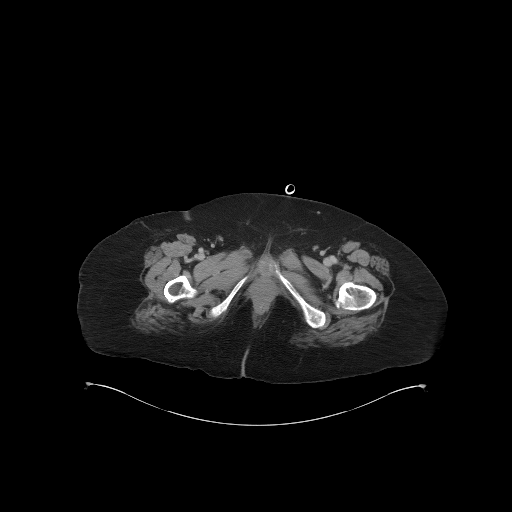
[im 6/99  bone]
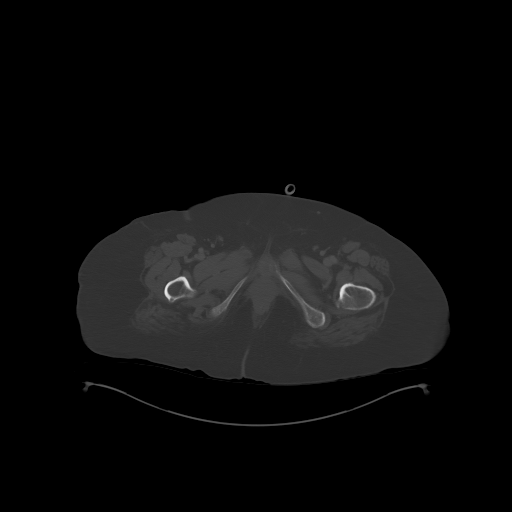
[im 16/99  soft-tissue]
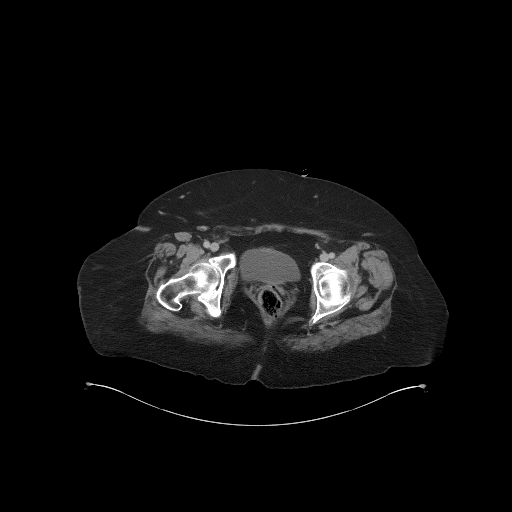
[im 21/99  soft-tissue]
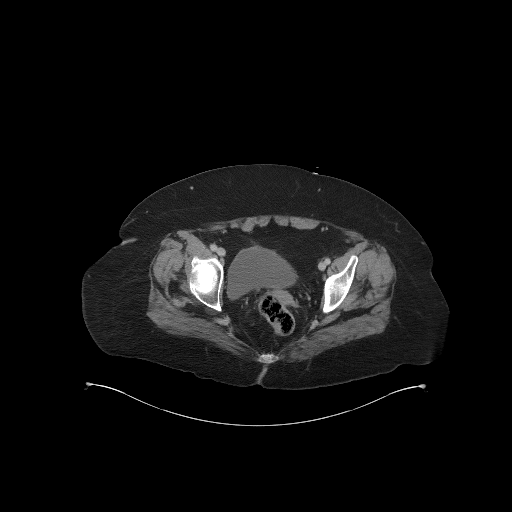
[im 26/99  soft-tissue]
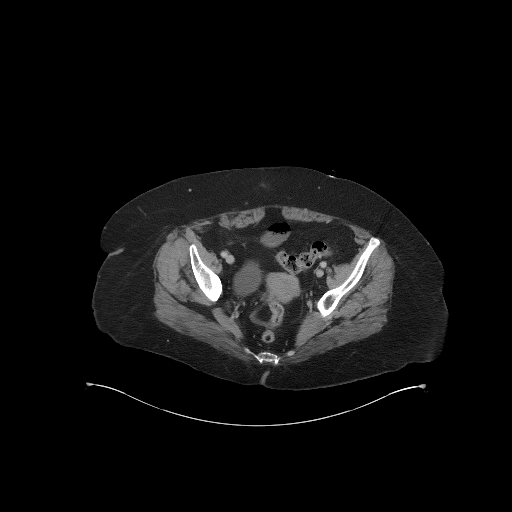
[im 37/99  soft-tissue]
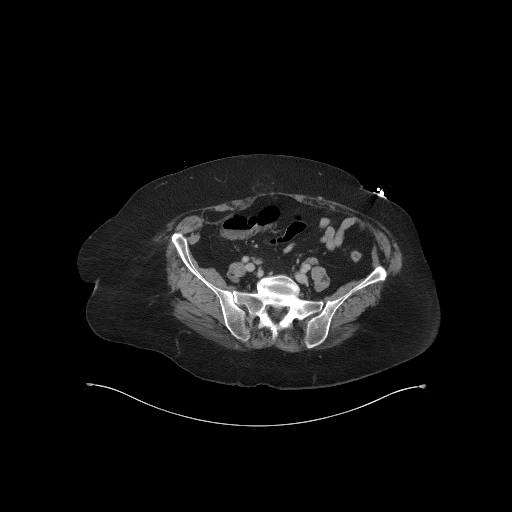
[im 42/99  soft-tissue]
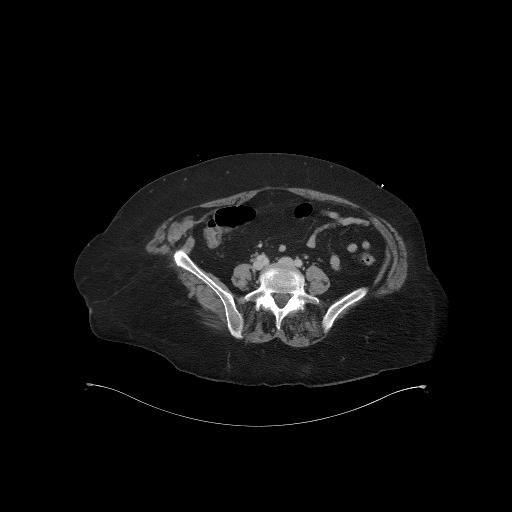
[im 52/99  soft-tissue]
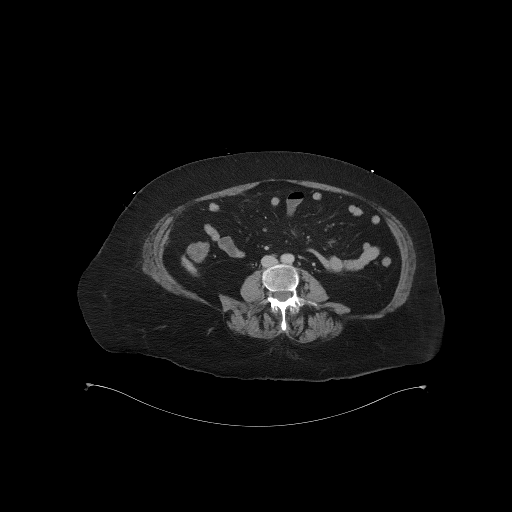
[im 57/99  soft-tissue]
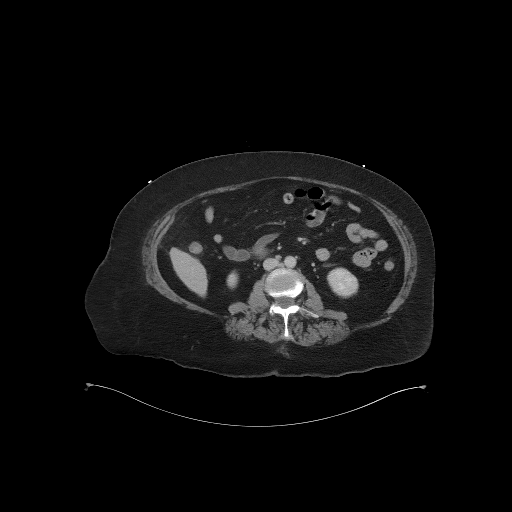
[im 62/99  soft-tissue]
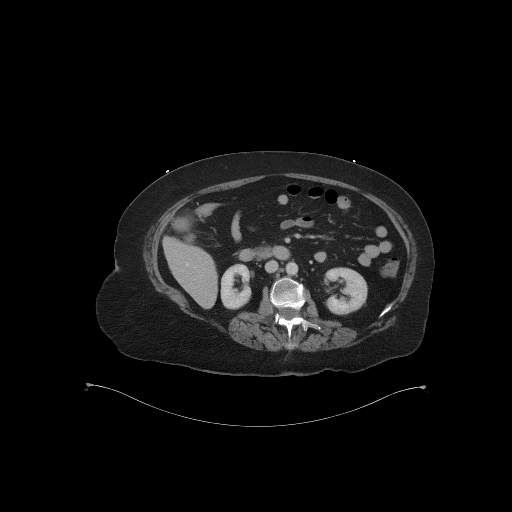
[im 62/99  bone]
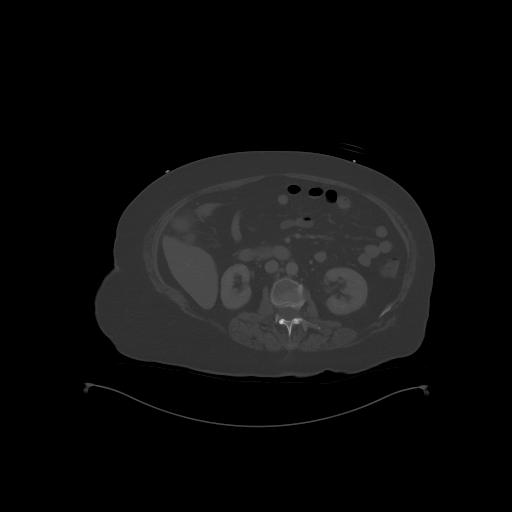
[im 73/99  soft-tissue]
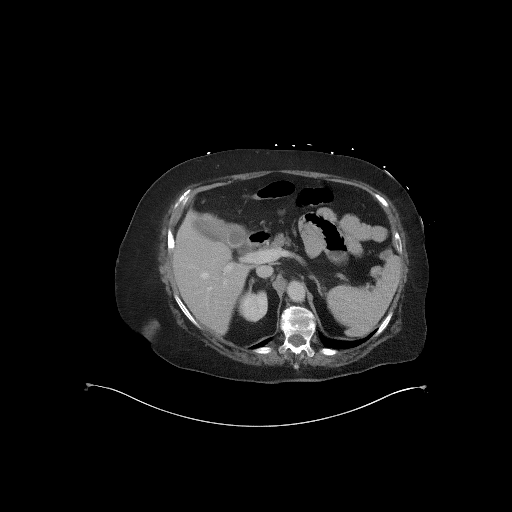
[im 78/99  soft-tissue]
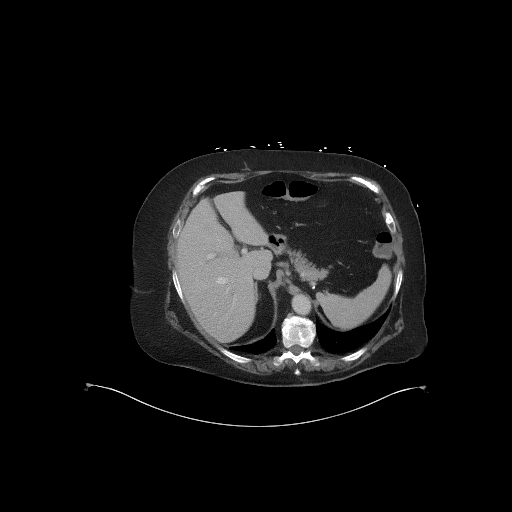
[im 83/99  soft-tissue]
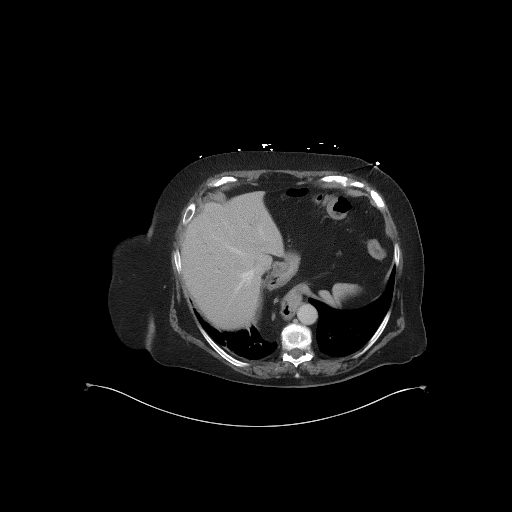
[im 93/99  soft-tissue]
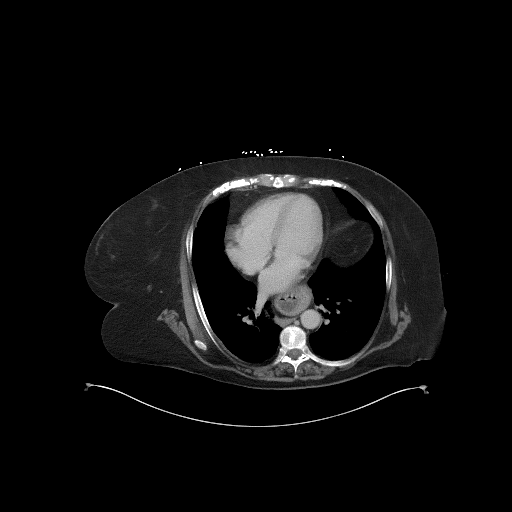

[Series 7: coronal st · coronal · 0.83mm/px · 3 of 100 slices shown]
[im 34/100  soft-tissue]
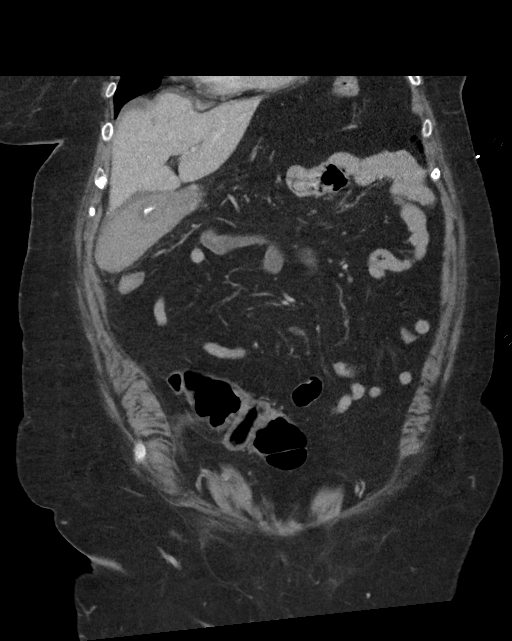
[im 45/100  soft-tissue]
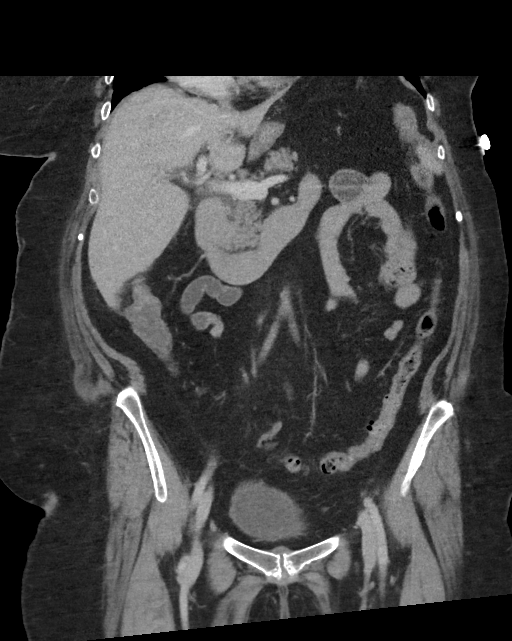
[im 56/100  soft-tissue]
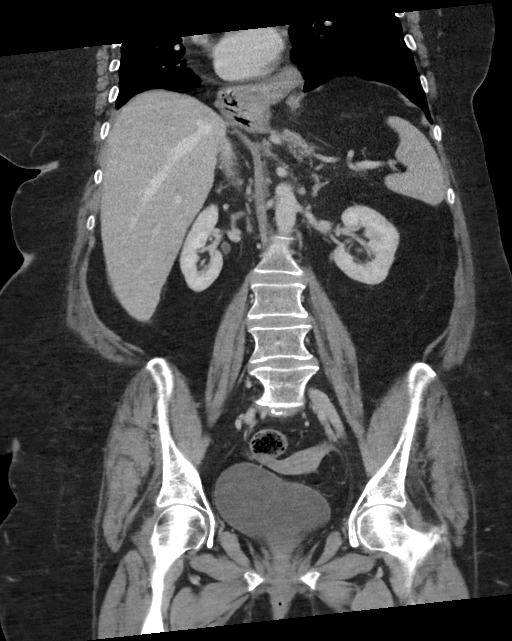

[16 of 46 positions shown; findings below may reference images not displayed]

FINDINGS: Lower chest: Volume loss in the right lower lobe associated with a
hiatal hernia. No pleural effusions.

Hepatobiliary: Cholelithiasis with small amount of pericholecystic
edema or stranding. Evidence for a large 2.2 cm stone at the
gallbladder base. High-density material within the gallbladder.
Small amount of pericholecystic fluid. Mild intrahepatic biliary
dilatation. Main portal venous system is patent. Extrahepatic bile
duct is not dilated.

Pancreas: Unremarkable. No pancreatic ductal dilatation or
surrounding inflammatory changes.

Spleen: Normal in size without focal abnormality.

Adrenals/Urinary Tract: Normal adrenal glands. Normal appearance of
both kidneys without hydronephrosis.

Stomach/Bowel: Large hiatal hernia containing majority of the
stomach. No evidence for gastric dilatation or inflammation. Mild
inflammatory changes near the duodenal bulb probably secondary to
the gallbladder inflammation. Mild inflammation around the hepatic
flexure probably secondary to the gallbladder inflammation. Appendix
is not confidently identified but no inflammatory changes around the
cecum or terminal ileum.

Vascular/Lymphatic: Main visceral arteries are patent.
Atherosclerotic calcifications in the aorta without aneurysm. Venous
structures are unremarkable. No lymph node enlargement in the
abdomen or pelvis.

Reproductive: Uterus and bilateral adnexa are unremarkable.

Other: Trace free fluid in the pelvis. Negative for free air.
Periumbilical ventral hernia containing fat on sequence 4, image 63.
Bilateral inguinal hernias containing fat.

Musculoskeletal: Degenerative facet disease in the lumbar spine.
IMPRESSION: 1. Cholelithiasis and evidence for acute cholecystitis. Small amount
of pericholecystic fluid. Trace fluid in the pelvis.
2. Large hiatal hernia.
3. Uncomplicated ventral and inguinal hernias.

## 2020-03-08 IMAGING — US US ABDOMEN LIMITED
1 series · 14 of 25 positions shown · non-contrast
Comparison: Abdominal CT from earlier today

CLINICAL DATA: Cholelithiasis.

EXAM:
ULTRASOUND ABDOMEN LIMITED RIGHT UPPER QUADRANT

[Series 1: us abdomen limited · 14 of 62 slices shown]
[im 1/62]
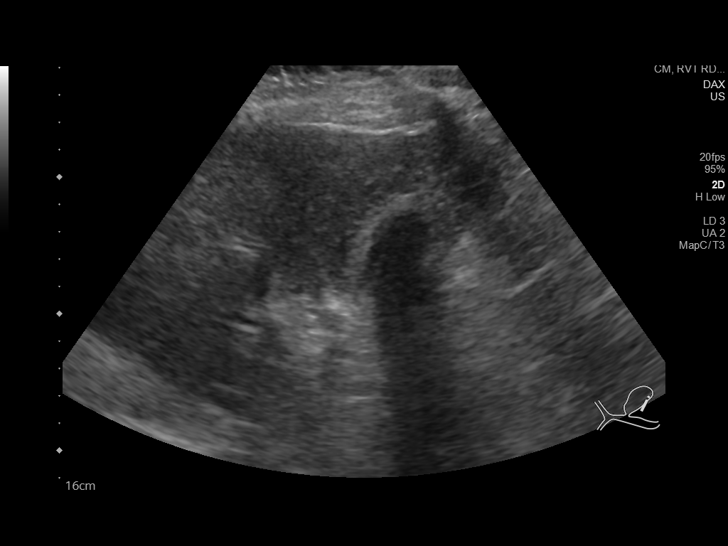
[im 6/62]
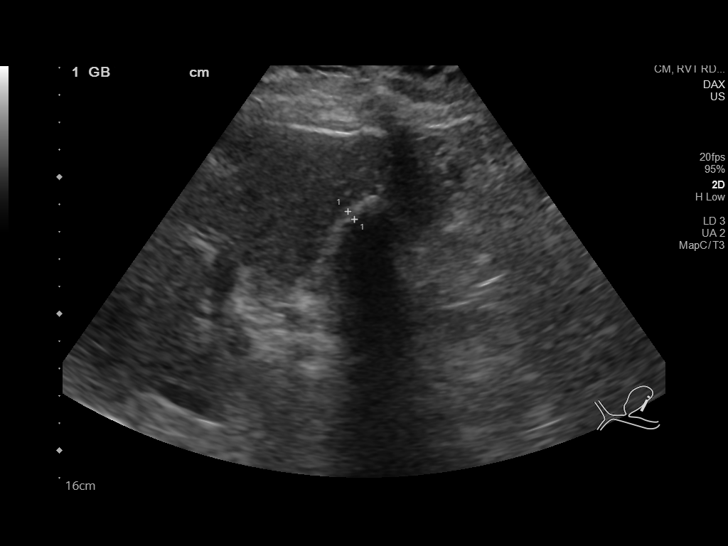
[im 11/62]
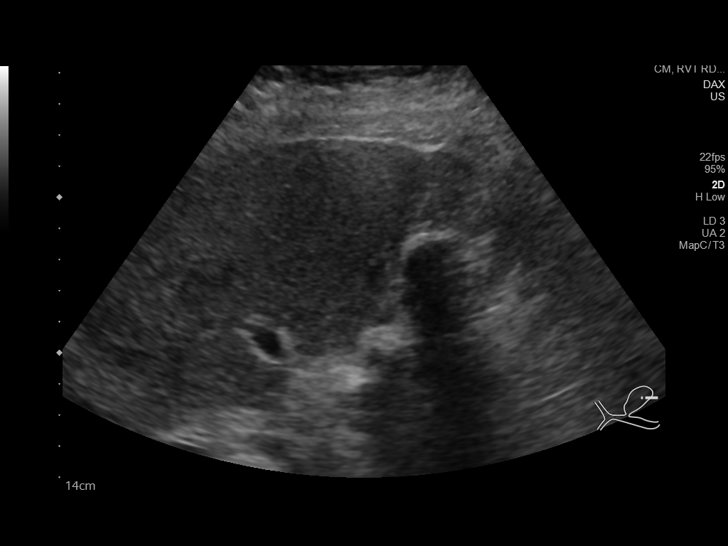
[im 16/62]
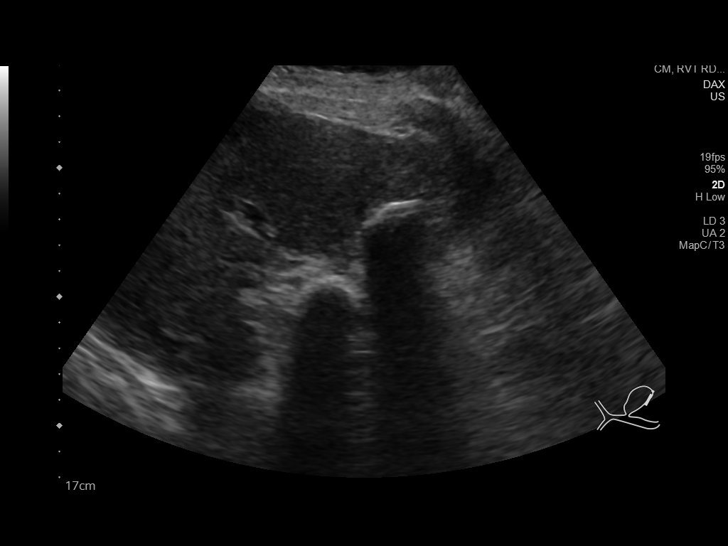
[im 21/62]
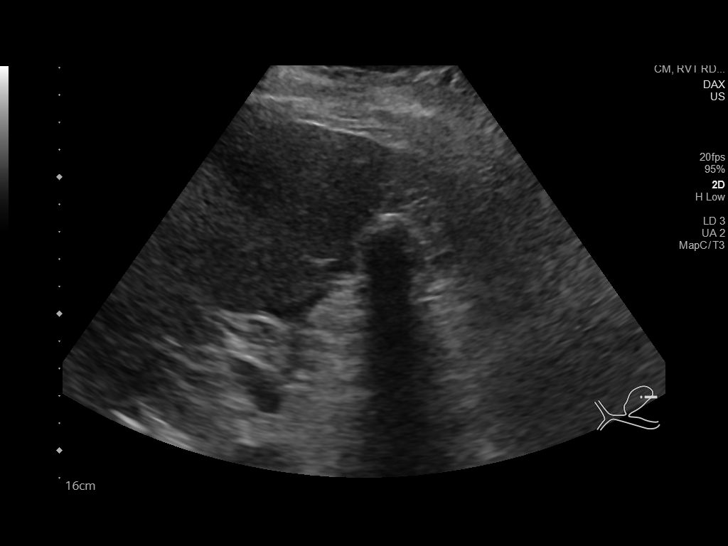
[im 23/62]
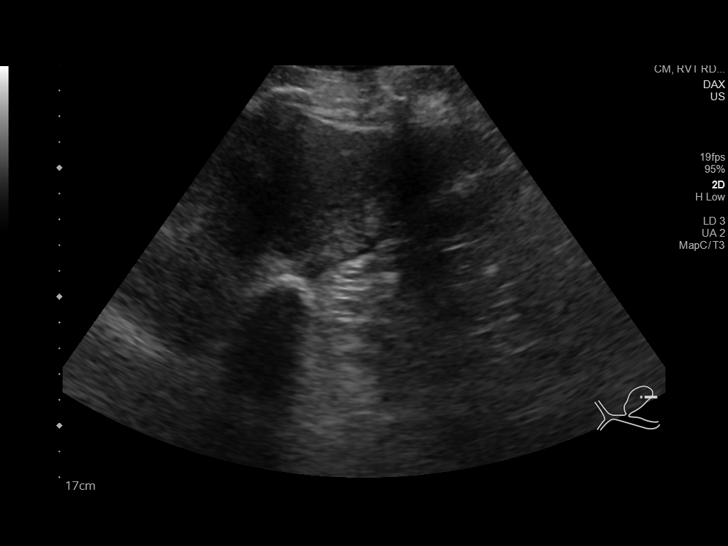
[im 28/62]
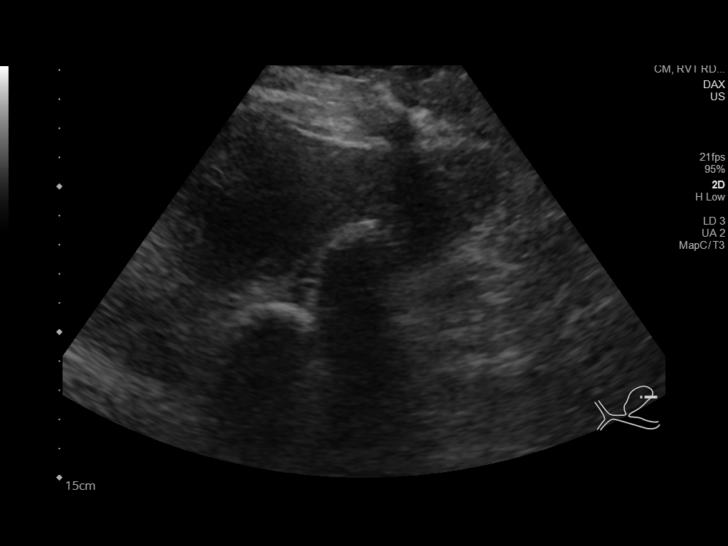
[im 34/62]
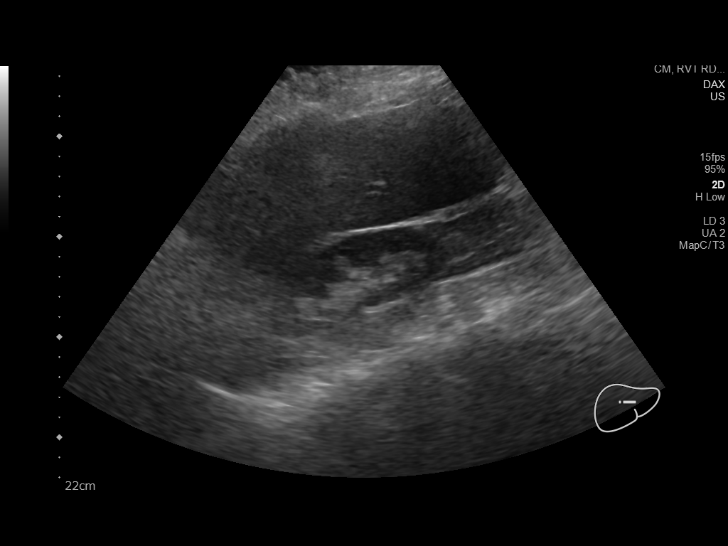
[im 39/62]
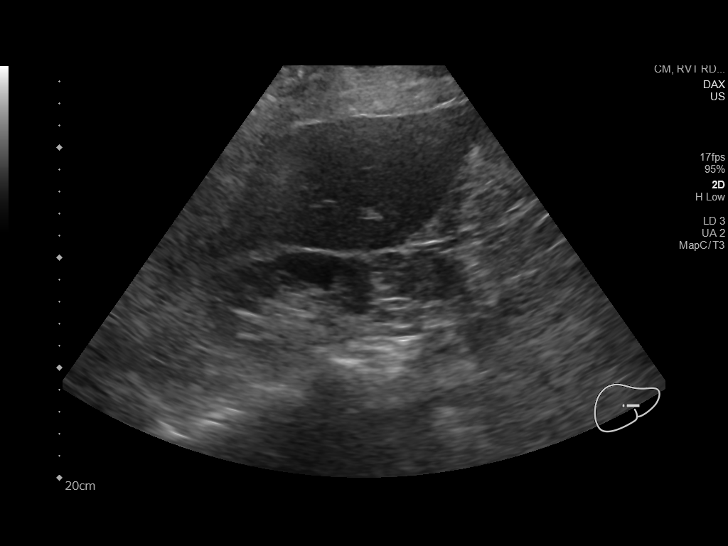
[im 41/62]
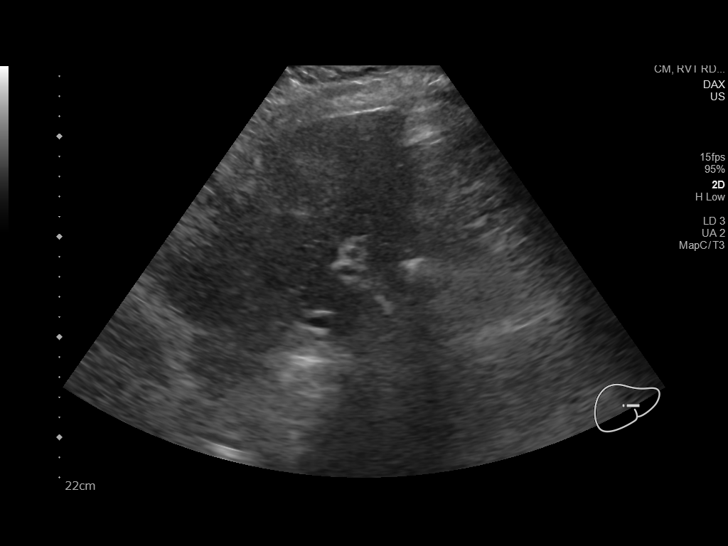
[im 46/62]
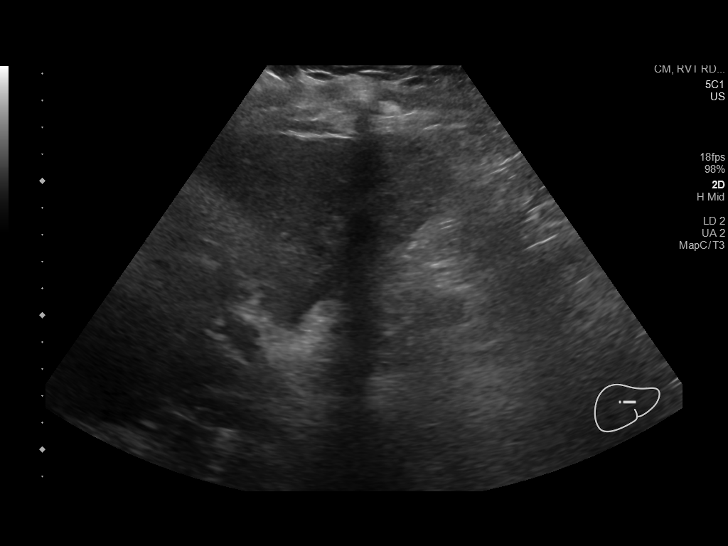
[im 51/62]
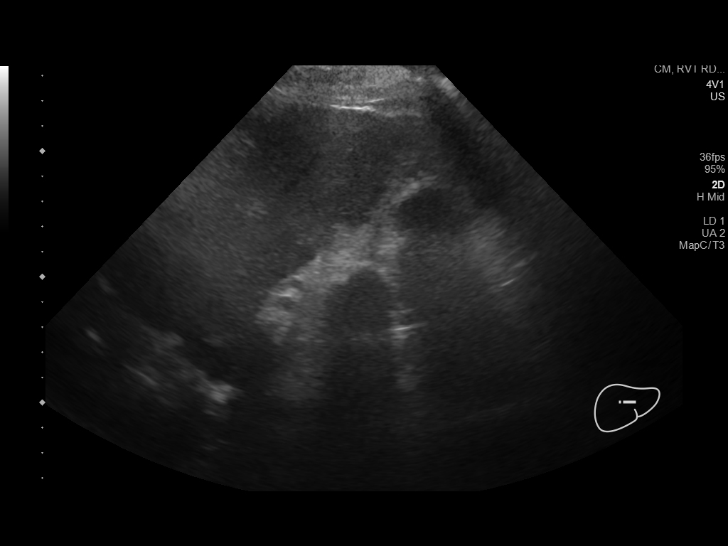
[im 56/62]
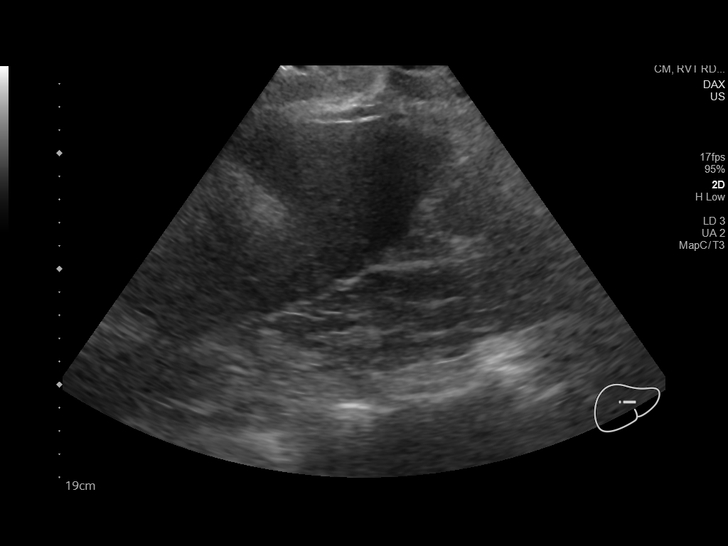
[im 62/62]
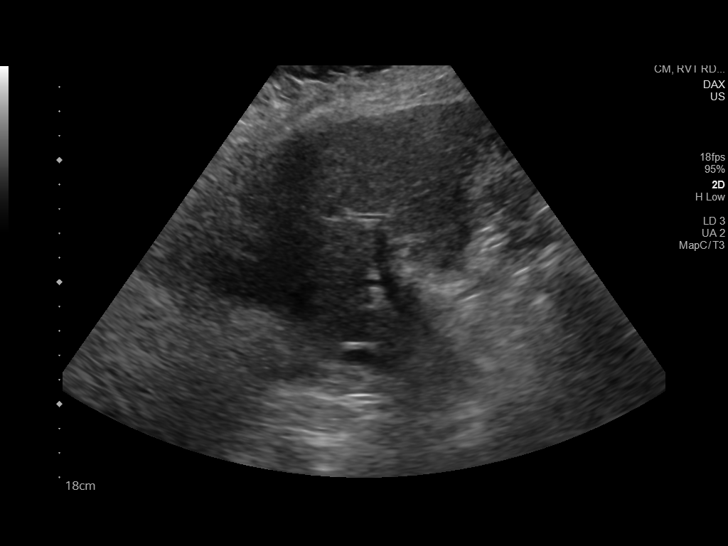

[14 of 25 positions shown; findings below may reference images not displayed]

FINDINGS: Gallbladder:

Two large calculi; no gallbladder wall calcification by CT. No focal
tenderness or convincing wall thickening. Pericholecystic
low-density deep to the gallbladder on prior CT; no visible
pericholecystic edema.

Common bile duct:

Diameter: 4 mm

Liver:

No focal lesion identified. Within normal limits in parenchymal
echogenicity. Portal vein is patent on color Doppler imaging with
normal direction of blood flow towards the liver.
IMPRESSION: Cholelithiasis without associated findings of acute cholecystitis.
Given discrepancy with prior CT, consider HIDA scan.

## 2023-06-01 ENCOUNTER — Encounter (HOSPITAL_COMMUNITY): Payer: Self-pay

## 2023-06-01 ENCOUNTER — Other Ambulatory Visit: Payer: Self-pay

## 2023-06-01 ENCOUNTER — Emergency Department (HOSPITAL_COMMUNITY): Payer: Medicare Other

## 2023-06-01 ENCOUNTER — Emergency Department (HOSPITAL_COMMUNITY)
Admission: EM | Admit: 2023-06-01 | Discharge: 2023-06-02 | Disposition: A | Discharge status: Home / Self Care | Payer: Medicare Other | Attending: Student | Admitting: Student

## 2023-06-01 DIAGNOSIS — Z7982 Long term (current) use of aspirin: Secondary | ICD-10-CM | POA: Diagnosis not present

## 2023-06-01 DIAGNOSIS — K439 Ventral hernia without obstruction or gangrene: Secondary | ICD-10-CM

## 2023-06-01 DIAGNOSIS — I10 Essential (primary) hypertension: Secondary | ICD-10-CM | POA: Diagnosis not present

## 2023-06-01 DIAGNOSIS — D72829 Elevated white blood cell count, unspecified: Secondary | ICD-10-CM | POA: Diagnosis not present

## 2023-06-01 DIAGNOSIS — K469 Unspecified abdominal hernia without obstruction or gangrene: Secondary | ICD-10-CM | POA: Diagnosis not present

## 2023-06-01 DIAGNOSIS — N3001 Acute cystitis with hematuria: Secondary | ICD-10-CM | POA: Diagnosis not present

## 2023-06-01 DIAGNOSIS — E039 Hypothyroidism, unspecified: Secondary | ICD-10-CM | POA: Diagnosis not present

## 2023-06-01 DIAGNOSIS — Z79899 Other long term (current) drug therapy: Secondary | ICD-10-CM | POA: Insufficient documentation

## 2023-06-01 DIAGNOSIS — R1030 Lower abdominal pain, unspecified: Secondary | ICD-10-CM | POA: Diagnosis present

## 2023-06-01 LAB — CBC
HCT: 42.8 % (ref 36.0–46.0)
Hemoglobin: 13.8 g/dL (ref 12.0–15.0)
MCH: 28.8 pg (ref 26.0–34.0)
MCHC: 32.2 g/dL (ref 30.0–36.0)
MCV: 89.4 fL (ref 80.0–100.0)
Platelets: 198 10*3/uL (ref 150–400)
RBC: 4.79 MIL/uL (ref 3.87–5.11)
RDW: 12.6 % (ref 11.5–15.5)
WBC: 6 10*3/uL (ref 4.0–10.5)
nRBC: 0 % (ref 0.0–0.2)

## 2023-06-01 LAB — COMPREHENSIVE METABOLIC PANEL
ALT: 12 U/L (ref 0–44)
AST: 16 U/L (ref 15–41)
Albumin: 3.7 g/dL (ref 3.5–5.0)
Alkaline Phosphatase: 50 U/L (ref 38–126)
Anion gap: 8 (ref 5–15)
BUN: 9 mg/dL (ref 8–23)
CO2: 25 mmol/L (ref 22–32)
Calcium: 9.7 mg/dL (ref 8.9–10.3)
Chloride: 99 mmol/L (ref 98–111)
Creatinine, Ser: 0.72 mg/dL (ref 0.44–1.00)
GFR, Estimated: >60 mL/min (ref >60)
Glucose, Bld: 109 mg/dL — ABNORMAL HIGH (ref 70–99)
Potassium: 3.8 mmol/L (ref 3.5–5.1)
Sodium: 132 mmol/L — ABNORMAL LOW (ref 135–145)
Total Bilirubin: 0.9 mg/dL (ref <1.2)
Total Protein: 6.7 g/dL (ref 6.5–8.1)

## 2023-06-01 LAB — URINALYSIS, ROUTINE W REFLEX MICROSCOPIC
Bilirubin Urine: NEGATIVE
Glucose, UA: NEGATIVE mg/dL
Hgb urine dipstick: NEGATIVE
Ketones, ur: NEGATIVE mg/dL
Nitrite: NEGATIVE
Protein, ur: 30 mg/dL — AB
Specific Gravity, Urine: 1.024 (ref 1.005–1.030)
WBC, UA: >50 WBC/hpf (ref 0–5)
pH: 5 (ref 5.0–8.0)

## 2023-06-01 LAB — LIPASE, BLOOD: Lipase: 23 U/L (ref 11–51)

## 2023-06-01 MED ORDER — ONDANSETRON 4 MG PO TBDP
4.0000 mg | ORAL_TABLET | Freq: Once | ORAL | Status: AC
  Administered 2023-06-01: 4 mg via ORAL
  Filled 2023-06-01: qty 1

## 2023-06-01 MED ORDER — CEPHALEXIN 500 MG PO CAPS
500.0000 mg | ORAL_CAPSULE | Freq: Once | ORAL | Status: AC
  Administered 2023-06-01: 500 mg via ORAL
  Filled 2023-06-01: qty 1

## 2023-06-01 MED ORDER — NAPROXEN 375 MG PO TABS: 375.0000 mg | ORAL_TABLET | Freq: Two times a day (BID) | ORAL | 0 refills | Status: AC

## 2023-06-01 MED ORDER — CEFADROXIL 500 MG PO CAPS: 500.0000 mg | ORAL_CAPSULE | Freq: Two times a day (BID) | ORAL | 0 refills | Status: AC

## 2023-06-01 MED ORDER — ONDANSETRON 4 MG PO TBDP: 4.0000 mg | ORAL_TABLET | Freq: Three times a day (TID) | ORAL | 0 refills | Status: AC | PRN

## 2023-06-01 MED ORDER — CEFADROXIL 500 MG PO CAPS
500.0000 mg | ORAL_CAPSULE | Freq: Two times a day (BID) | ORAL | Status: DC
  Filled 2023-06-01 (×4): qty 1

## 2023-06-01 MED ORDER — NAPROXEN 250 MG PO TABS
500.0000 mg | ORAL_TABLET | Freq: Once | ORAL | Status: AC
  Administered 2023-06-01: 500 mg via ORAL
  Filled 2023-06-01: qty 2

## 2023-06-01 NOTE — ED Provider Notes (Signed)
11:51 PM Assumed care from Dr. Posey Rea, please see their note for full history, physical and decision making until this point. In brief this is a 87 y.o. year old female who presented to the ED tonight with Abdominal Pain     87 yo F w/ abdominal pain. Has UTI. Ct with possible fat incarceration in a hernia and she does have pain there but no other concern for acute intraabdominal pathology. Getting PO challenge and reeval for disposition.   Personally discussed patient's ct scan findings with radiology after personal interpretation and there's no bowel in the defects and all the inflammation is in same area which coincides with the area of ttp on my exam. Labs are reassuring. Vitals reassuring and tolerating PO without e/o obstruction. The incarceration is likely related to fat which will either improve or necrose over time. Will treat with NSAIDs. She will fu w/ surgery outpatient otherwise return here if any new/worsening symptoms.   Discharge instructions, including strict return precautions for new or worsening symptoms, given. Patient and/or family verbalized understanding and agreement with the plan as described.   Labs, studies and imaging reviewed by myself and considered in medical decision making if ordered. Imaging interpreted by radiology.  Labs Reviewed  COMPREHENSIVE METABOLIC PANEL - Abnormal; Notable for the following components:      Result Value   Sodium 132 (*)    Glucose, Bld 109 (*)    All other components within normal limits  URINALYSIS, ROUTINE W REFLEX MICROSCOPIC - Abnormal; Notable for the following components:   Color, Urine AMBER (*)    APPearance HAZY (*)    Protein, ur 30 (*)    Leukocytes,Ua LARGE (*)    Bacteria, UA RARE (*)    All other components within normal limits  URINE CULTURE  LIPASE, BLOOD  CBC    CT Renal Stone Study  Final Result      No follow-ups on file.    Johanna Matto, Barbara Cower, MD 06/02/23 [phone]

## 2023-06-01 NOTE — ED Triage Notes (Signed)
Pt arrives with reports of vaginal bleeding and discharge with lower abdominal pain for a week. Went to UC today and was sent to ED for labs.

## 2023-06-02 MED ORDER — DOCUSATE SODIUM 100 MG PO CAPS: 100.0000 mg | ORAL_CAPSULE | Freq: Two times a day (BID) | ORAL | 0 refills | Status: AC

## 2023-06-02 NOTE — ED Provider Notes (Signed)
[facility] DEPARTMENT AT [facility] Provider Note  CSN: [id number] Arrival date & time: 06/01/23 1733  Chief Complaint(s) Abdominal Pain  HPI Dorothy Sandoval is a 87 y.o. female with PMH HTN, hypothyroidism who presents emergency department for evaluation of abdominal pain, dysuria and hematuria.  Symptoms have been progressively worsening over the last 1 week with bloody urine and lower abdominal pain.  Went to urgent care who transferred her to the emergency department for further evaluation.  Here in the ER, she is endorsing suprapubic abdominal pain and nausea but denies chest pain, shortness of breath, headache, fever or other systemic symptoms.   Past Medical History Past Medical History:  Diagnosis Date   Hypertension    Thyroid disease    Patient Active Problem List   Diagnosis Date Noted   Calculus of gallbladder with acute cholecystitis without obstruction 06/12/2018   Primary osteoarthritis of left knee 04/27/2017   Home Medication(s) Prior to Admission medications   Medication Sig Start Date End Date Taking? Authorizing Provider  cefadroxil (DURICEF) 500 MG capsule Take 1 capsule (500 mg total) by mouth 2 (two) times daily for 7 days. 06/01/23 06/08/23 Yes Maleaha Hughett, MD  docusate sodium (COLACE) 100 MG capsule Take 1 capsule (100 mg total) by mouth every 12 (twelve) hours. 06/02/23  Yes Mesner, Barbara Cower, MD  naproxen (NAPROSYN) 375 MG tablet Take 1 tablet (375 mg total) by mouth 2 (two) times daily. 06/01/23  Yes Anshul Meddings, MD  ondansetron (ZOFRAN-ODT) 4 MG disintegrating tablet Take 1 tablet (4 mg total) by mouth every 8 (eight) hours as needed for nausea or vomiting. 06/01/23  Yes Enslee Bibbins, MD  albuterol (PROVENTIL HFA;VENTOLIN HFA) 108 (90 Base) MCG/ACT inhaler Inhale 1-2 puffs into the lungs every 6 (six) hours as needed for wheezing or shortness of breath.    [provider]  aspirin EC 81 MG tablet Take 81 mg by mouth  daily.    [provider]  citalopram (CELEXA) 10 MG tablet Take 10 mg by mouth daily. 11/11/16   [provider]  furosemide (LASIX) 20 MG tablet Take 20 mg by mouth daily. 04/01/16   [provider]  levothyroxine (SYNTHROID) 50 MCG tablet Take 25 mcg by mouth daily. 04/23/16   [provider]  LORazepam (ATIVAN) 0.5 MG tablet Take 0.5 mg by mouth every 8 (eight) hours as needed for anxiety.  04/03/16   [provider]  meclizine (ANTIVERT) 25 MG tablet Take 25 mg by mouth 3 (three) times daily as needed for dizziness.    [provider]  metoprolol succinate (TOPROL-XL) 50 MG 24 hr tablet Take 50 mg by mouth daily. 12/09/19   [provider]  omeprazole (PRILOSEC) 20 MG capsule Take 20 mg by mouth daily. 04/21/16   [provider]  TRELEGY ELLIPTA 100-62.5-25 MCG/INH AEPB Inhale 1 puff into the lungs daily. 04/24/18   [provider]  Past Surgical History Past Surgical History:  Procedure Laterality Date   CHOLECYSTECTOMY N/A 06/13/2018   Procedure: LAPAROSCOPIC CHOLECYSTECTOMY;  Surgeon: Lucretia Roers, MD;  Location: [facility];  Service: General;  Laterality: N/A;   REPLACEMENT TOTAL KNEE BILATERAL     Family History Family History  Problem Relation Age of Onset   Heart failure Father    Diabetes Sister    Diabetes Brother     Social History Social History   Tobacco Use   Smoking status: Never   Smokeless tobacco: Never  Vaping Use   Vaping status: Never Used  Substance Use Topics   Alcohol use: No   Drug use: No   Allergies Meloxicam and Other  Review of Systems Review of Systems  Gastrointestinal:  Positive for abdominal pain and nausea.  Genitourinary:  Positive for dysuria and hematuria.    Physical Exam Vital Signs  I have reviewed the triage vital  signs BP (!) 148/65   Pulse 68   Temp 98.5 F (36.9 C) (Oral)   Resp 17   Ht 5\' 3"  (1.6 m)   Wt 97.1 kg   SpO2 94%   BMI 37.91 kg/m   Physical Exam Vitals and nursing note reviewed.  Constitutional:      General: She is not in acute distress.    Appearance: She is well-developed.  HENT:     Head: Normocephalic and atraumatic.  Eyes:     Conjunctiva/sclera: Conjunctivae normal.  Cardiovascular:     Rate and Rhythm: Normal rate and regular rhythm.     Heart sounds: No murmur heard. Pulmonary:     Effort: Pulmonary effort is normal. No respiratory distress.     Breath sounds: Normal breath sounds.  Abdominal:     Palpations: Abdomen is soft.     Tenderness: There is abdominal tenderness in the suprapubic area.     Hernia: A hernia is present. Hernia is present in the umbilical area.  Musculoskeletal:        General: No swelling.     Cervical back: Neck supple.  Skin:    General: Skin is warm and dry.     Capillary Refill: Capillary refill takes less than 2 seconds.  Neurological:     Mental Status: She is alert.  Psychiatric:        Mood and Affect: Mood normal.     ED Results and Treatments Labs (all labs ordered are listed, but only abnormal results are displayed) Labs Reviewed  COMPREHENSIVE METABOLIC PANEL - Abnormal; Notable for the following components:      Result Value   Sodium 132 (*)    Glucose, Bld 109 (*)    All other components within normal limits  URINALYSIS, ROUTINE W REFLEX MICROSCOPIC - Abnormal; Notable for the following components:   Color, Urine AMBER (*)    APPearance HAZY (*)    Protein, ur 30 (*)    Leukocytes,Ua LARGE (*)    Bacteria, UA RARE (*)    All other components within normal limits  URINE CULTURE  LIPASE, BLOOD  CBC  Radiology CT Renal Stone Study Result Date: 06/01/2023 CLINICAL DATA:  Abdominal  pain, flank pain, lower abdominal pain, vaginal bleeding EXAM: CT ABDOMEN AND PELVIS WITHOUT CONTRAST TECHNIQUE: Multidetector CT imaging of the abdomen and pelvis was performed following the standard protocol without IV contrast. RADIATION DOSE REDUCTION: This exam was performed according to the departmental dose-optimization program which includes automated exposure control, adjustment of the mA and/or kV according to patient size and/or use of iterative reconstruction technique. COMPARISON:  06/12/2018 FINDINGS: Lower chest: Large hiatal hernia with the stomach displaced into the thoracic compartment. No acute abnormality. Hepatobiliary: No focal liver abnormality is seen. Status post cholecystectomy. No biliary dilatation. Pancreas: Unremarkable. No pancreatic ductal dilatation or surrounding inflammatory changes. Spleen: Normal in size without focal abnormality. Adrenals/Urinary Tract: Adrenal glands are unremarkable. Kidneys are normal, without renal calculi, focal lesion, or hydronephrosis. Bladder is unremarkable. Stomach/Bowel: The small and large bowel are unremarkable. Appendix normal. No free intraperitoneal gas or fluid. Vascular/Lymphatic: Aortic atherosclerosis. No enlarged abdominal or pelvic lymph nodes. Reproductive: 18 mm simple appearing cystic lesion has developed within the right ovary since prior examination. The pelvic organs are otherwise unremarkable. Other: Small fat containing umbilical and 2 supraumbilical hernias are identified with focal fascial defects best appreciated on sagittal image # 71/6. There is fluid within the supraumbilical hernia sacs and mild surrounding inflammatory stranding within the subcutaneous fat of the anterior abdominal wall suggest incarceration. Small bilateral fat containing inguinal hernia is also identified. Musculoskeletal: Osseous structures are age-appropriate. No acute bone abnormality. IMPRESSION: 1. Small fat containing umbilical and 2 supraumbilical  hernias with focal fascial defects. Fluid within the supraumbilical hernia sacs and mild surrounding inflammatory stranding within the subcutaneous fat of the anterior abdominal wall suggest incarceration. 2. Large hiatal hernia with the stomach displaced into the thoracic compartment. 3. 18 mm simple appearing cystic lesion has developed within the right ovary since prior examination. No follow-up imaging recommended. Note: This recommendation does not apply to premenarchal patients and to those with increased risk (genetic, family history, elevated tumor markers or other high-risk factors) of ovarian cancer. Reference: JACR 2020 Feb; 17(2):248-254 Aortic Atherosclerosis (ICD10-I70.0). Electronically Signed   By: Helyn Numbers M.D.   On: 06/01/2023 23:33    Pertinent labs & imaging results that were available during my care of the patient were reviewed by me and considered in my medical decision making (see MDM for details).  Medications Ordered in ED Medications  ondansetron (ZOFRAN-ODT) disintegrating tablet 4 mg (4 mg Oral Given 06/01/23 2237)  naproxen (NAPROSYN) tablet 500 mg (500 mg Oral Given 06/01/23 2237)  cephALEXin (KEFLEX) capsule 500 mg (500 mg Oral Given 06/01/23 2350)                                                                                                                                     Procedures Procedures  (including critical care time)  Medical Decision Making / ED  Course   This patient presents to the ED for concern of dysuria, abdominal pain, this involves an extensive number of treatment options, and is a complaint that carries with it a high risk of complications and morbidity.  The differential diagnosis includes UTI, pyelonephritis, endometrial carcinoma, cervical cancer, nephrolithiasis  MDM: Patient seen emergency room for evaluation of dysuria and abdominal pain.  Physical exam with suprapubic abdominal tenderness to palpation and a small umbilical  hernia.  No external lesions seen on vaginal exam.  Laboratory evaluation largely unremarkable.  Urinalysis concerning for infection with large leuk esterase, 6-10 red blood cells, greater than 50 white blood cells and rare bacteria.  Urine culture sent.  Will cover with Keflex.  CT stone study with a small fat-containing umbilical hernia and 2 supraumbilical hernias with focal fascial defects and fluid within the supraumbilical hernia sacs raising concern for incarceration, large hiatal hernia, 18 mm cystic lesion in the right ovary.  I then reevaluated the patient and attempted to reduce the supraumbilical hernias.  She did have some mild tenderness to palpation in this area but did not feel rigid and she did not have any guarding on this exam.  We will do a p.o. trial and oncoming provider will speak with radiology to ensure that there is no bowel in these sacs.  Please see provider signout continuation of workup.  Anticipate discharge.   Additional history obtained: -Additional history obtained from multiple family members -External records from outside source obtained and reviewed including: Chart review including previous notes, labs, imaging, consultation notes   Lab Tests: -I ordered, reviewed, and interpreted labs.   The pertinent results include:   Labs Reviewed  COMPREHENSIVE METABOLIC PANEL - Abnormal; Notable for the following components:      Result Value   Sodium 132 (*)    Glucose, Bld 109 (*)    All other components within normal limits  URINALYSIS, ROUTINE W REFLEX MICROSCOPIC - Abnormal; Notable for the following components:   Color, Urine AMBER (*)    APPearance HAZY (*)    Protein, ur 30 (*)    Leukocytes,Ua LARGE (*)    Bacteria, UA RARE (*)    All other components within normal limits  URINE CULTURE  LIPASE, BLOOD  CBC      Imaging Studies ordered: I ordered imaging studies including CT stone study I independently visualized and interpreted imaging. I agree  with the radiologist interpretation   Medicines ordered and prescription drug management: Meds ordered this encounter  Medications   ondansetron (ZOFRAN-ODT) disintegrating tablet 4 mg   naproxen (NAPROSYN) tablet 500 mg   DISCONTD: cefadroxil (DURICEF) capsule 500 mg   cephALEXin (KEFLEX) capsule 500 mg   cefadroxil (DURICEF) 500 MG capsule    Sig: Take 1 capsule (500 mg total) by mouth 2 (two) times daily for 7 days.    Dispense:  14 capsule    Refill:  0   naproxen (NAPROSYN) 375 MG tablet    Sig: Take 1 tablet (375 mg total) by mouth 2 (two) times daily.    Dispense:  20 tablet    Refill:  0   ondansetron (ZOFRAN-ODT) 4 MG disintegrating tablet    Sig: Take 1 tablet (4 mg total) by mouth every 8 (eight) hours as needed for nausea or vomiting.    Dispense:  20 tablet    Refill:  0   docusate sodium (COLACE) 100 MG capsule    Sig: Take 1 capsule (100 mg total) by mouth every  12 (twelve) hours.    Dispense:  60 capsule    Refill:  0    -I have reviewed the patients home medicines and have made adjustments as needed  Critical interventions none    Cardiac Monitoring: The patient was maintained on a cardiac monitor.  I personally viewed and interpreted the cardiac monitored which showed an underlying rhythm of: NSR  Social Determinants of Health:  Factors impacting patients care include: none   Reevaluation: After the interventions noted above, I reevaluated the patient and found that they have :improved  Co morbidities that complicate the patient evaluation  Past Medical History:  Diagnosis Date   Hypertension    Thyroid disease       Dispostion: I considered admission for this patient, and disposition pending reevaluation by oncoming provider.  Please see provider signout note for continuation of workup.     Final Clinical Impression(s) / ED Diagnoses Final diagnoses:  Acute cystitis with hematuria  Hernia of abdominal wall     @PCDICTATION @     Glendora Score, MD 06/02/23 1306

## 2023-06-03 LAB — URINE CULTURE: Culture: >=100000 — AB

## 2023-06-22 ENCOUNTER — Telehealth: Payer: Self-pay | Admitting: *Deleted

## 2023-06-22 NOTE — Telephone Encounter (Signed)
 Received fax from Dorothy Cassette, NP with referral for evaluation of hernia.   Patient was seen in [facility] ER on 06/01/2023. CT obtained and results are as follows: IMPRESSION: 1. Small fat containing umbilical and 2 supraumbilical hernias with focal fascial defects. Fluid within the supraumbilical hernia sacs and mild surrounding inflammatory stranding within the subcutaneous fat of the anterior abdominal wall suggest incarceration. 2. Large hiatal hernia with the stomach displaced into the thoracic compartment. 3. 18 mm simple appearing cystic lesion has developed within the right ovary since prior examination. No follow-up imaging recommended. Note: This recommendation does not apply to premenarchal patients and to those with increased risk (genetic, family history, elevated tumor markers or other high-risk factors) of ovarian cancer.  Call placed to patient to schedule surgical consultation. Patient reports that she is not having any issues with umbilical/ supraumbilical hernia at this time and does not wish to proceed with referral.

## 2023-08-10 ENCOUNTER — Telehealth: Payer: Self-pay | Admitting: *Deleted

## 2023-08-10 NOTE — Telephone Encounter (Signed)
 Received call from patient [phone]~ telephone.   Patient reports that she is having lower abdominal pain and would like to come in to see surgeon for evaluation. States that she was seen in ER in December 2024 and was advised that she has multiple hernias.   Advised that hernias located in December were noted around umbilicus and epigastric area. Advised that lower abdominal pain would not be symptomatic of the noted hernias.   Patient did report that she is being treated by PCP for UTI. Advised that UTI may cause lower abdominal pain. Advised to complete treatment as prescribed. If Sx worsen or do not improve, advised to follow up with PCP for further evaluation.
